# Patient Record
Sex: Male | Born: 1965
Health system: Southern US, Community
[De-identification: ages and names within clinical notes are randomized; demographics above are authoritative.]

## PROBLEM LIST (undated history)

## (undated) DIAGNOSIS — R748 Abnormal levels of other serum enzymes: Secondary | ICD-10-CM

## (undated) DIAGNOSIS — K089 Disorder of teeth and supporting structures, unspecified: Secondary | ICD-10-CM

## (undated) DIAGNOSIS — E785 Hyperlipidemia, unspecified: Secondary | ICD-10-CM

## (undated) DIAGNOSIS — K5792 Diverticulitis of intestine, part unspecified, without perforation or abscess without bleeding: Secondary | ICD-10-CM

## (undated) DIAGNOSIS — E538 Deficiency of other specified B group vitamins: Secondary | ICD-10-CM

## (undated) DIAGNOSIS — R0600 Dyspnea, unspecified: Secondary | ICD-10-CM

## (undated) DIAGNOSIS — K4090 Unilateral inguinal hernia, without obstruction or gangrene, not specified as recurrent: Secondary | ICD-10-CM

## (undated) DIAGNOSIS — R0602 Shortness of breath: Secondary | ICD-10-CM

## (undated) DIAGNOSIS — I2510 Atherosclerotic heart disease of native coronary artery without angina pectoris: Secondary | ICD-10-CM

## (undated) DIAGNOSIS — R109 Unspecified abdominal pain: Secondary | ICD-10-CM

## (undated) DIAGNOSIS — M109 Gout, unspecified: Secondary | ICD-10-CM

## (undated) DIAGNOSIS — I259 Chronic ischemic heart disease, unspecified: Secondary | ICD-10-CM

## (undated) DIAGNOSIS — I10 Essential (primary) hypertension: Secondary | ICD-10-CM

## (undated) DIAGNOSIS — R509 Fever, unspecified: Secondary | ICD-10-CM

## (undated) DIAGNOSIS — R52 Pain, unspecified: Secondary | ICD-10-CM

## (undated) DIAGNOSIS — E7800 Pure hypercholesterolemia, unspecified: Secondary | ICD-10-CM

## (undated) DIAGNOSIS — J029 Acute pharyngitis, unspecified: Secondary | ICD-10-CM

## (undated) DIAGNOSIS — R5383 Other fatigue: Secondary | ICD-10-CM

## (undated) DIAGNOSIS — K921 Melena: Secondary | ICD-10-CM

## (undated) DIAGNOSIS — D72829 Elevated white blood cell count, unspecified: Secondary | ICD-10-CM

## (undated) DIAGNOSIS — N529 Male erectile dysfunction, unspecified: Secondary | ICD-10-CM

## (undated) DIAGNOSIS — I429 Cardiomyopathy, unspecified: Secondary | ICD-10-CM

## (undated) DIAGNOSIS — I509 Heart failure, unspecified: Secondary | ICD-10-CM

## (undated) DIAGNOSIS — K529 Noninfective gastroenteritis and colitis, unspecified: Secondary | ICD-10-CM

## (undated) DIAGNOSIS — R110 Nausea: Secondary | ICD-10-CM

## (undated) DIAGNOSIS — E782 Mixed hyperlipidemia: Secondary | ICD-10-CM

## (undated) DIAGNOSIS — I219 Acute myocardial infarction, unspecified: Secondary | ICD-10-CM

## (undated) DIAGNOSIS — J329 Chronic sinusitis, unspecified: Secondary | ICD-10-CM

## (undated) DIAGNOSIS — L02619 Cutaneous abscess of unspecified foot: Secondary | ICD-10-CM

## (undated) DIAGNOSIS — F341 Dysthymic disorder: Secondary | ICD-10-CM

## (undated) DIAGNOSIS — Z6823 Body mass index (BMI) 23.0-23.9, adult: Secondary | ICD-10-CM

## (undated) DIAGNOSIS — R300 Dysuria: Secondary | ICD-10-CM

## (undated) DIAGNOSIS — R195 Other fecal abnormalities: Secondary | ICD-10-CM

## (undated) DIAGNOSIS — M25559 Pain in unspecified hip: Secondary | ICD-10-CM

## (undated) DIAGNOSIS — E119 Type 2 diabetes mellitus without complications: Secondary | ICD-10-CM

## (undated) DIAGNOSIS — R202 Paresthesia of skin: Secondary | ICD-10-CM

## (undated) DIAGNOSIS — E559 Vitamin D deficiency, unspecified: Secondary | ICD-10-CM

## (undated) DIAGNOSIS — I214 Non-ST elevation (NSTEMI) myocardial infarction: Secondary | ICD-10-CM

## (undated) DIAGNOSIS — R112 Nausea with vomiting, unspecified: Secondary | ICD-10-CM

## (undated) DIAGNOSIS — L309 Dermatitis, unspecified: Secondary | ICD-10-CM

## (undated) DIAGNOSIS — R6889 Other general symptoms and signs: Secondary | ICD-10-CM

## (undated) DIAGNOSIS — J40 Bronchitis, not specified as acute or chronic: Secondary | ICD-10-CM

## (undated) DIAGNOSIS — R9431 Abnormal electrocardiogram [ECG] [EKG]: Secondary | ICD-10-CM

## (undated) DIAGNOSIS — R062 Wheezing: Secondary | ICD-10-CM

## (undated) DIAGNOSIS — E860 Dehydration: Secondary | ICD-10-CM

## (undated) DIAGNOSIS — J069 Acute upper respiratory infection, unspecified: Secondary | ICD-10-CM

## (undated) DIAGNOSIS — J449 Chronic obstructive pulmonary disease, unspecified: Secondary | ICD-10-CM

## (undated) DIAGNOSIS — Z720 Tobacco use: Secondary | ICD-10-CM

## (undated) DIAGNOSIS — R059 Cough, unspecified: Secondary | ICD-10-CM

## (undated) DIAGNOSIS — R079 Chest pain, unspecified: Secondary | ICD-10-CM

## (undated) HISTORY — DX: Dysuria: R30.0

## (undated) HISTORY — DX: Chronic ischemic heart disease, unspecified: I25.9

## (undated) HISTORY — DX: Elevated white blood cell count, unspecified: D72.829

## (undated) HISTORY — DX: Acute upper respiratory infection, unspecified: J06.9

## (undated) HISTORY — DX: Unspecified abdominal pain: R10.9

## (undated) HISTORY — DX: Vitamin D deficiency, unspecified: E55.9

## (undated) HISTORY — DX: Dehydration: E86.0

## (undated) HISTORY — DX: Cough, unspecified: R05.9

## (undated) HISTORY — DX: Wheezing: R06.2

## (undated) HISTORY — DX: Gout, unspecified: M10.9

## (undated) HISTORY — DX: Tobacco use: Z72.0

## (undated) HISTORY — DX: Acute pharyngitis, unspecified: J02.9

## (undated) HISTORY — DX: Pure hypercholesterolemia, unspecified: E78.00

## (undated) HISTORY — DX: Chronic sinusitis, unspecified: J32.9

## (undated) HISTORY — DX: Other fecal abnormalities: R19.5

## (undated) HISTORY — DX: Male erectile dysfunction, unspecified: N52.9

## (undated) HISTORY — DX: Other general symptoms and signs: R68.89

## (undated) HISTORY — DX: Nausea: R11.0

## (undated) HISTORY — DX: Abnormal levels of other serum enzymes: R74.8

## (undated) HISTORY — DX: Shortness of breath: R06.02

## (undated) HISTORY — DX: Chest pain, unspecified: R07.9

## (undated) HISTORY — DX: Hyperlipidemia, unspecified: E78.5

## (undated) HISTORY — DX: Dyspnea, unspecified: R06.00

## (undated) HISTORY — DX: Pain, unspecified: R52

## (undated) HISTORY — DX: Other fatigue: R53.83

## (undated) HISTORY — DX: Mixed hyperlipidemia: E78.2

## (undated) HISTORY — DX: Dysthymic disorder: F34.1

## (undated) HISTORY — DX: Essential (primary) hypertension: I10

## (undated) HISTORY — DX: Cutaneous abscess of unspecified foot: L02.619

## (undated) HISTORY — DX: Diverticulitis of intestine, part unspecified, without perforation or abscess without bleeding: K57.92

## (undated) HISTORY — DX: Atherosclerotic heart disease of native coronary artery without angina pectoris: I25.10

## (undated) HISTORY — DX: Chronic obstructive pulmonary disease, unspecified: J44.9

## (undated) HISTORY — DX: Paresthesia of skin: R20.2

## (undated) HISTORY — DX: Melena: K92.1

## (undated) HISTORY — DX: Bronchitis, not specified as acute or chronic: J40

## (undated) HISTORY — DX: Nausea with vomiting, unspecified: R11.2

## (undated) HISTORY — DX: Non-ST elevation (NSTEMI) myocardial infarction: I21.4

## (undated) HISTORY — DX: Disorder of teeth and supporting structures, unspecified: K08.9

## (undated) HISTORY — DX: Heart failure, unspecified: I50.9

## (undated) HISTORY — DX: Body mass index (BMI) 23.0-23.9, adult: Z68.23

## (undated) HISTORY — PX: CORONARY ARTERY BYPASS GRAFT: SHX141

## (undated) HISTORY — DX: Deficiency of other specified B group vitamins: E53.8

## (undated) HISTORY — DX: Type 2 diabetes mellitus without complications: E11.9

## (undated) HISTORY — DX: Noninfective gastroenteritis and colitis, unspecified: K52.9

## (undated) HISTORY — DX: Unilateral inguinal hernia, without obstruction or gangrene, not specified as recurrent: K40.90

## (undated) HISTORY — DX: Abnormal electrocardiogram (ECG) (EKG): R94.31

## (undated) HISTORY — DX: Pain in unspecified hip: M25.559

## (undated) HISTORY — DX: Dermatitis, unspecified: L30.9

## (undated) HISTORY — DX: Fever, unspecified: R50.9

## (undated) HISTORY — DX: Cardiomyopathy, unspecified: I42.9

## (undated) HISTORY — DX: Acute myocardial infarction, unspecified: I21.9

---

## 2020-09-06 ENCOUNTER — Encounter (INDEPENDENT_AMBULATORY_CARE_PROVIDER_SITE_OTHER): Payer: Self-pay

## 2020-09-06 ENCOUNTER — Ambulatory Visit (INDEPENDENT_AMBULATORY_CARE_PROVIDER_SITE_OTHER): Payer: BC Managed Care – PPO | Admitting: Cardiology

## 2020-09-06 ENCOUNTER — Other Ambulatory Visit: Payer: Self-pay

## 2020-09-06 ENCOUNTER — Encounter: Payer: Self-pay | Admitting: Cardiology

## 2020-09-06 VITALS — BP 94/86 | HR 95 | Ht 70.0 in | Wt 197.0 lb

## 2020-09-06 DIAGNOSIS — I2510 Atherosclerotic heart disease of native coronary artery without angina pectoris: Secondary | ICD-10-CM | POA: Diagnosis not present

## 2020-09-06 DIAGNOSIS — I5042 Chronic combined systolic (congestive) and diastolic (congestive) heart failure: Secondary | ICD-10-CM | POA: Diagnosis not present

## 2020-09-06 DIAGNOSIS — I255 Ischemic cardiomyopathy: Secondary | ICD-10-CM

## 2020-09-06 NOTE — H&P (View-Only) (Signed)
Electrophysiology Office Note:    Date:  09/06/2020   ID:  Makael Stein, DOB 1966/04/05, MRN [id number]  PCP:  Perley Jain  [facility] Cardiologist:  No primary care provider on file.  [facility] Electrophysiologist:  Lanier Prude, MD   Referring MD: Martha Clan, MD  Chief Complaint: Chronic systolic heart failure secondary to ischemic cardiomyopathy  History of Present Illness:    Lejuan Botto is a 54 y.o. male who presents for an evaluation of chronic systolic heart failure secondary to ischemic cardiomyopathy at the request of Dr. Clelia Croft. Their medical history includes coronary artery disease, hypertension, diabetes, hyperlipidemia.  Mr. Stogsdill had Covid earlier in 2021.  Due to the associated respiratory troubles, he presented to the emergency department where his troponins were found to be significantly elevated.  In March of this year he received 5 stents (2 to the right coronary artery, 1 to the proximal circumflex artery, 1 to the second obtuse marginal, 1 to the first diagonal).  Follow-up echocardiogram on March 5 showed an ejection fraction of 25% with akinesis of the apical anterior and lateral segments.  In May of this year he was admitted to [facility] in part for consideration of coronary artery bypass surgery.  MRI was performed showing extensive anterior scarring.  CABG was canceled due to the extensive scar burden.  At his last appointment with his primary physician and October, he was noted to have NYHA class III symptoms.  Echocardiogram on June 28, 2020 showed an ejection fraction of 30%.  The patient has been seen by [facility] in the past including their electrophysiology section. He and his wife tell me that his procedures been scheduled for ICD implant but then it was delayed for unclear reasons. The patient and his referring physician are concerned about the delay in would like to expedite ICD implant. Thus, he presents today for  consideration of a biventricular ICD.  He and his wife tell me that his functional status has declined since his initial diagnosis earlier this year. Now he can stand up and do dishes but then will get fatigued quickly. No syncopal episodes.   Past Medical History:  Diagnosis Date  . Abdominal pain   . Abnormal EKG   . Abnormal liver enzymes   . Abscess of great toe   . Bloody stools   . BMI 23.0-23.9, adult   . Body aches   . Bronchitis   . CAD (coronary artery disease)   . Cardiomyopathy (HCC)   . Chest pain   . CHF (congestive heart failure) (HCC)   . Chills with fever   . COPD (chronic obstructive pulmonary disease) (HCC)   . Cough   . Dehydration   . Dermatitis   . Diverticulitis   . DM (diabetes mellitus) (HCC)   . Dyslipidemia   . Dyspnea   . Dysthymia   . Dysuria   . ED (erectile dysfunction)   . Fatigue   . Flu-like symptoms   . Gastroenteritis   . Gout   . Hernia, inguinal   . Hip pain   . Hypercholesterolemia   . Hypertension   . Ischemic heart disease   . Leukocytosis   . Loose stools   . MI (myocardial infarction) (HCC)   . Mixed hyperlipidemia   . Nausea   . Nausea and vomiting   . NSTEMI (non-ST elevated myocardial infarction) (HCC)   . Paresthesia   . Poor dentition   . Sinusitis   . SOB (  shortness of breath)   . Sore throat   . Tobacco abuse   . URI (upper respiratory infection)   . Vitamin B12 deficiency   . Vitamin D deficiency   . Wheezing     Past Surgical History:  Procedure Laterality Date  . CORONARY ARTERY BYPASS GRAFT      Current Medications: Current Meds  Medication Sig  . acetaminophen (TYLENOL) 500 MG tablet Take 1,000 mg by mouth every 6 (six) hours as needed.  Marland Kitchen albuterol (VENTOLIN HFA) 108 (90 Base) MCG/ACT inhaler Inhale 1 puff into the lungs every 6 (six) hours as needed for wheezing or shortness of breath.  . Albuterol Sulfate 2.5 MG/0.5ML NEBU Inhale into the lungs.  Marland Kitchen ascorbic acid (VITAMIN C) 1000 MG tablet  Take 2,000 mg by mouth daily.  Marland Kitchen aspirin EC 81 MG tablet Take 81 mg by mouth daily. Swallow whole.  . Budeson-Glycopyrrol-Formoterol (BREZTRI AEROSPHERE) 160-9-4.8 MCG/ACT AERO Inhale 2 puffs into the lungs 2 (two) times daily.  . budesonide-formoterol (SYMBICORT) 160-4.5 MCG/ACT inhaler Inhale 2 puffs into the lungs 2 (two) times daily.  . Cholecalciferol 50 MCG (2000 UT) CAPS Take 1 capsule by mouth daily.  . dapagliflozin propanediol (FARXIGA) 10 MG TABS tablet Take 10 mg by mouth daily.  . digoxin (LANOXIN) 0.125 MG tablet Take by mouth every other day.   Marland Kitchen JANUMET XR 50-500 MG TB24 Take 1 tablet by mouth daily.  . nitroGLYCERIN (NITROSTAT) 0.4 MG SL tablet Place 0.4 mg under the tongue every 5 (five) minutes as needed for chest pain.  Marland Kitchen ondansetron (ZOFRAN) 8 MG tablet Take by mouth every 8 (eight) hours as needed for nausea or vomiting.  . sacubitril-valsartan (ENTRESTO) 24-26 MG Take 1 tablet by mouth daily.  . simvastatin (ZOCOR) 20 MG tablet Take 20 mg by mouth daily.  Marland Kitchen spironolactone (ALDACTONE) 25 MG tablet Take 25 mg by mouth daily.  . ticagrelor (BRILINTA) 90 MG TABS tablet Take by mouth 2 (two) times daily.  Marland Kitchen torsemide (DEMADEX) 20 MG tablet Take 20 mg by mouth daily.  . Zinc 100 MG TABS Take 200 mg by mouth.   . [DISCONTINUED] sacubitril-valsartan (ENTRESTO) 24-26 MG Take 1 tablet by mouth 2 (two) times daily.  . [DISCONTINUED] sitaGLIPtin-metformin (JANUMET) 50-500 MG tablet Take 1 tablet by mouth 2 (two) times daily with a meal.     Allergies:   Patient has no known allergies.   Social History   Socioeconomic History  . Marital status: Married    Spouse name: Not on file  . Number of children: Not on file  . Years of education: Not on file  . Highest education level: Not on file  Occupational History  . Not on file  Tobacco Use  . Smoking status: Former Games developer  . Smokeless tobacco: Never Used  Substance and Sexual Activity  . Alcohol use: Never  . Drug use:  Never  . Sexual activity: Not on file  Other Topics Concern  . Not on file  Social History Narrative  . Not on file   Social Determinants of Health   Financial Resource Strain:   . Difficulty of Paying Living Expenses: Not on file  Food Insecurity:   . Worried About Programme researcher, broadcasting/film/video in the Last Year: Not on file  . Ran Out of Food in the Last Year: Not on file  Transportation Needs:   . Lack of Transportation (Medical): Not on file  . Lack of Transportation (Non-Medical): Not on file  Physical  Activity:   . Days of Exercise per Week: Not on file  . Minutes of Exercise per Session: Not on file  Stress:   . Feeling of Stress : Not on file  Social Connections:   . Frequency of Communication with Friends and Family: Not on file  . Frequency of Social Gatherings with Friends and Family: Not on file  . Attends Religious Services: Not on file  . Active Member of Clubs or Organizations: Not on file  . Attends Club or Organization Meetings: Not on file  . Marital Status: Not on file     Family History: The patient's family history includes Diabetes in his father, mother, and sister; Heart disease in his father; Hypertension in his father and mother.  ROS:   Please see the history of present illness.    All other systems reviewed and are negative.  EKGs/Labs/Other Studies Reviewed:    The following studies were reviewed today: Prior notes from outside hospital, ECG  EKG:  The ekg ordered today demonstrates sinus rhythm and right bundle branch block.  QRS duration appears to be 170 ms.  Recent Labs: No results found for requested labs within last 8760 hours.  Recent Lipid Panel No results found for: CHOL, TRIG, HDL, CHOLHDL, VLDL, LDLCALC, LDLDIRECT  Physical Exam:    VS:  BP 94/86   Pulse 95   Ht 5' 10" (1.778 m)   Wt 197 lb (89.4 kg)   SpO2 97%   BMI 28.27 kg/m     Wt Readings from Last 3 Encounters:  09/06/20 197 lb (89.4 kg)     GEN:  Well nourished, well  developed in no acute distress HEENT: Normal NECK: No JVD; No carotid bruits LYMPHATICS: No lymphadenopathy CARDIAC: RRR, no murmurs, rubs, gallops RESPIRATORY:  Clear to auscultation without rales, wheezing or rhonchi  ABDOMEN: Soft, non-tender, non-distended MUSCULOSKELETAL:  No edema; No deformity  SKIN: Warm and dry NEUROLOGIC:  Alert and oriented x 3 PSYCHIATRIC:  Normal affect   ASSESSMENT:    1. Chronic combined systolic and diastolic heart failure (HCC)   2. Ischemic cardiomyopathy   3. Coronary artery disease involving native coronary artery of native heart without angina pectoris    PLAN:    In order of problems listed above:  1. Chronic combined systolic and diastolic heart failure secondary to ischemic cardiomyopathy NYHA class III symptoms. Wide right bundle branch block pattern with a QRS duration of 170 ms. We discussed defibrillator therapy in detail including the traditional indications for CRT-D. Given his wide right bundle branch block pattern, I do think he is a candidate for CRT-D therapy. I discussed CRT-D procedure in depth with the patient including the risks and the potential that a coronary sinus lead would not be placed. I discussed how this procedure is already scheduled with [facility] and that I agreed with her plan but he and his wife both wanted to proceed with scheduling at [place].  The patient has an ischemic CM (EF 30%), NYHA Class III CHF, and CAD.  He is referred by Dr. Shaw for risk stratification of sudden death and consideration of ICD implantation.  At this time, he meets criteria for ICD implantation for primary prevention of sudden death.  I have had a thorough discussion with the patient reviewing options.  The patient and their family (if available) have had opportunities to ask questions and have them answered. The patient and I have decided together through a shared decision making process to implant   ICD at this time.   Risks, benefits,  alternatives to ICD implantation were discussed in detail with the patient today. The patient understands that the risks include but are not limited to bleeding, infection, pneumothorax, perforation, tamponade, vascular damage, renal failure, MI, stroke, death, inappropriate shocks, and lead dislodgement and wishes to proceed.  We will therefore schedule device implantation at the next available time.    Medication Adjustments/Labs and Tests Ordered: Current medicines are reviewed at length with the patient today.  Concerns regarding medicines are outlined above.  Orders Placed This Encounter  Procedures  . EKG 12-Lead   No orders of the defined types were placed in this encounter.    Signed, Steffanie Dunn, MD, [facility]  09/06/2020 9:27 PM    Electrophysiology [facility]

## 2020-09-06 NOTE — Progress Notes (Signed)
Electrophysiology Office Note:    Date:  09/06/2020   ID:  Christopher Garcia, DOB 1966/04/05, MRN [id number]  PCP:  Perley Jain  [facility] Cardiologist:  No primary care provider on file.  [facility] Electrophysiologist:  Lanier Prude, MD   Referring MD: Martha Clan, MD  Chief Complaint: Chronic systolic heart failure secondary to ischemic cardiomyopathy  History of Present Illness:    Christopher Garcia is a 54 y.o. male who presents for an evaluation of chronic systolic heart failure secondary to ischemic cardiomyopathy at the request of Dr. Clelia Croft. Their medical history includes coronary artery disease, hypertension, diabetes, hyperlipidemia.  Christopher Garcia had Covid earlier in 2021.  Due to the associated respiratory troubles, Christopher Garcia presented to the emergency department where Christopher Garcia troponins were found to be significantly elevated.  In March of this year Christopher Garcia received 5 stents (2 to the right coronary artery, 1 to the proximal circumflex artery, 1 to the second obtuse marginal, 1 to the first diagonal).  Follow-up echocardiogram on March 5 showed an ejection fraction of 25% with akinesis of the apical anterior and lateral segments.  In May of this year Christopher Garcia was admitted to [facility] in part for consideration of coronary artery bypass surgery.  MRI was performed showing extensive anterior scarring.  CABG was canceled due to the extensive scar burden.  At Christopher Garcia last appointment with Christopher Garcia primary physician and October, Christopher Garcia was noted to have NYHA class III symptoms.  Echocardiogram on June 28, 2020 showed an ejection fraction of 30%.  The patient has been seen by [facility] in the past including their electrophysiology section. Christopher Garcia and Christopher Garcia wife tell me that Christopher Garcia procedures been scheduled for ICD implant but then it was delayed for unclear reasons. The patient and Christopher Garcia referring physician are concerned about the delay in would like to expedite ICD implant. Thus, Christopher Garcia presents today for  consideration of a biventricular ICD.  Christopher Garcia and Christopher Garcia wife tell me that Christopher Garcia functional status has declined since Christopher Garcia initial diagnosis earlier this year. Now Christopher Garcia can stand up and do dishes but then will get fatigued quickly. No syncopal episodes.   Past Medical History:  Diagnosis Date  . Abdominal pain   . Abnormal EKG   . Abnormal liver enzymes   . Abscess of great toe   . Bloody stools   . BMI 23.0-23.9, adult   . Body aches   . Bronchitis   . CAD (coronary artery disease)   . Cardiomyopathy (HCC)   . Chest pain   . CHF (congestive heart failure) (HCC)   . Chills with fever   . COPD (chronic obstructive pulmonary disease) (HCC)   . Cough   . Dehydration   . Dermatitis   . Diverticulitis   . DM (diabetes mellitus) (HCC)   . Dyslipidemia   . Dyspnea   . Dysthymia   . Dysuria   . ED (erectile dysfunction)   . Fatigue   . Flu-like symptoms   . Gastroenteritis   . Gout   . Hernia, inguinal   . Hip pain   . Hypercholesterolemia   . Hypertension   . Ischemic heart disease   . Leukocytosis   . Loose stools   . MI (myocardial infarction) (HCC)   . Mixed hyperlipidemia   . Nausea   . Nausea and vomiting   . NSTEMI (non-ST elevated myocardial infarction) (HCC)   . Paresthesia   . Poor dentition   . Sinusitis   . SOB (  shortness of breath)   . Sore throat   . Tobacco abuse   . URI (upper respiratory infection)   . Vitamin B12 deficiency   . Vitamin D deficiency   . Wheezing     Past Surgical History:  Procedure Laterality Date  . CORONARY ARTERY BYPASS GRAFT      Current Medications: Current Meds  Medication Sig  . acetaminophen (TYLENOL) 500 MG tablet Take 1,000 mg by mouth every 6 (six) hours as needed.  Marland Kitchen albuterol (VENTOLIN HFA) 108 (90 Base) MCG/ACT inhaler Inhale 1 puff into the lungs every 6 (six) hours as needed for wheezing or shortness of breath.  . Albuterol Sulfate 2.5 MG/0.5ML NEBU Inhale into the lungs.  Marland Kitchen ascorbic acid (VITAMIN C) 1000 MG tablet  Take 2,000 mg by mouth daily.  Marland Kitchen aspirin EC 81 MG tablet Take 81 mg by mouth daily. Swallow whole.  . Budeson-Glycopyrrol-Formoterol (BREZTRI AEROSPHERE) 160-9-4.8 MCG/ACT AERO Inhale 2 puffs into the lungs 2 (two) times daily.  . budesonide-formoterol (SYMBICORT) 160-4.5 MCG/ACT inhaler Inhale 2 puffs into the lungs 2 (two) times daily.  . Cholecalciferol 50 MCG (2000 UT) CAPS Take 1 capsule by mouth daily.  . dapagliflozin propanediol (FARXIGA) 10 MG TABS tablet Take 10 mg by mouth daily.  . digoxin (LANOXIN) 0.125 MG tablet Take by mouth every other day.   Marland Kitchen JANUMET XR 50-500 MG TB24 Take 1 tablet by mouth daily.  . nitroGLYCERIN (NITROSTAT) 0.4 MG SL tablet Place 0.4 mg under the tongue every 5 (five) minutes as needed for chest pain.  Marland Kitchen ondansetron (ZOFRAN) 8 MG tablet Take by mouth every 8 (eight) hours as needed for nausea or vomiting.  . sacubitril-valsartan (ENTRESTO) 24-26 MG Take 1 tablet by mouth daily.  . simvastatin (ZOCOR) 20 MG tablet Take 20 mg by mouth daily.  Marland Kitchen spironolactone (ALDACTONE) 25 MG tablet Take 25 mg by mouth daily.  . ticagrelor (BRILINTA) 90 MG TABS tablet Take by mouth 2 (two) times daily.  Marland Kitchen torsemide (DEMADEX) 20 MG tablet Take 20 mg by mouth daily.  . Zinc 100 MG TABS Take 200 mg by mouth.   . [DISCONTINUED] sacubitril-valsartan (ENTRESTO) 24-26 MG Take 1 tablet by mouth 2 (two) times daily.  . [DISCONTINUED] sitaGLIPtin-metformin (JANUMET) 50-500 MG tablet Take 1 tablet by mouth 2 (two) times daily with a meal.     Allergies:   Patient has no known allergies.   Social History   Socioeconomic History  . Marital status: Married    Spouse name: Not on file  . Number of children: Not on file  . Years of education: Not on file  . Highest education level: Not on file  Occupational History  . Not on file  Tobacco Use  . Smoking status: Former Games developer  . Smokeless tobacco: Never Used  Substance and Sexual Activity  . Alcohol use: Never  . Drug use:  Never  . Sexual activity: Not on file  Other Topics Concern  . Not on file  Social History Narrative  . Not on file   Social Determinants of Health   Financial Resource Strain:   . Difficulty of Paying Living Expenses: Not on file  Food Insecurity:   . Worried About Programme researcher, broadcasting/film/video in the Last Year: Not on file  . Ran Out of Food in the Last Year: Not on file  Transportation Needs:   . Lack of Transportation (Medical): Not on file  . Lack of Transportation (Non-Medical): Not on file  Physical  Activity:   . Days of Exercise per Week: Not on file  . Minutes of Exercise per Session: Not on file  Stress:   . Feeling of Stress : Not on file  Social Connections:   . Frequency of Communication with Friends and Family: Not on file  . Frequency of Social Gatherings with Friends and Family: Not on file  . Attends Religious Services: Not on file  . Active Member of Clubs or Organizations: Not on file  . Attends BankerClub or Organization Meetings: Not on file  . Marital Status: Not on file     Family History: The patient's family history includes Diabetes in Christopher Garcia father, mother, and sister; Heart disease in Christopher Garcia father; Hypertension in Christopher Garcia father and mother.  ROS:   Please see the history of present illness.    All other systems reviewed and are negative.  EKGs/Labs/Other Studies Reviewed:    The following studies were reviewed today: Prior notes from outside hospital, ECG  EKG:  The ekg ordered today demonstrates sinus rhythm and right bundle branch block.  QRS duration appears to be 170 ms.  Recent Labs: No results found for requested labs within last 8760 hours.  Recent Lipid Panel No results found for: CHOL, TRIG, HDL, CHOLHDL, VLDL, LDLCALC, LDLDIRECT  Physical Exam:    VS:  BP 94/86   Pulse 95   Ht 5\' 10"  (1.778 m)   Wt 197 lb (89.4 kg)   SpO2 97%   BMI 28.27 kg/m     Wt Readings from Last 3 Encounters:  09/06/20 197 lb (89.4 kg)     GEN:  Well nourished, well  developed in no acute distress HEENT: Normal NECK: No JVD; No carotid bruits LYMPHATICS: No lymphadenopathy CARDIAC: RRR, no murmurs, rubs, gallops RESPIRATORY:  Clear to auscultation without rales, wheezing or rhonchi  ABDOMEN: Soft, non-tender, non-distended MUSCULOSKELETAL:  No edema; No deformity  SKIN: Warm and dry NEUROLOGIC:  Alert and oriented x 3 PSYCHIATRIC:  Normal affect   ASSESSMENT:    1. Chronic combined systolic and diastolic heart failure (HCC)   2. Ischemic cardiomyopathy   3. Coronary artery disease involving native coronary artery of native heart without angina pectoris    PLAN:    In order of problems listed above:  1. Chronic combined systolic and diastolic heart failure secondary to ischemic cardiomyopathy NYHA class III symptoms. Wide right bundle branch block pattern with a QRS duration of 170 ms. We discussed defibrillator therapy in detail including the traditional indications for CRT-D. Given Christopher Garcia wide right bundle branch block pattern, I do think Christopher Garcia is a candidate for CRT-D therapy. I discussed CRT-D procedure in depth with the patient including the risks and the potential that a coronary sinus lead would not be placed. I discussed how [facility] and that I agreed with her plan but Christopher Garcia and Christopher Garcia wife both [facility].  The patient has an ischemic CM (EF 30%), NYHA Class III CHF, and CAD.  Christopher Garcia is referred by Dr. Clelia CroftShaw for risk stratification of sudden death and consideration of ICD implantation.  At this time, Christopher Garcia meets criteria for ICD implantation for primary prevention of sudden death.  I have had a thorough discussion with the patient reviewing options.  The patient and their family (if available) have had opportunities to ask questions and have them answered. The patient and I have decided together through a shared decision making process to implant  ICD at this time.   Risks, benefits,  alternatives to ICD implantation were discussed in detail with the patient today. The patient understands that the risks include but are not limited to bleeding, infection, pneumothorax, perforation, tamponade, vascular damage, renal failure, MI, stroke, death, inappropriate shocks, and lead dislodgement and wishes to proceed.  We will therefore schedule device implantation at the next available time.    Medication Adjustments/Labs and Tests Ordered: Current medicines are reviewed at length with the patient today.  Concerns regarding medicines are outlined above.  Orders Placed This Encounter  Procedures  . EKG 12-Lead   No orders of the defined types were placed in this encounter.    Signed, Steffanie Dunn, MD, [facility]  09/06/2020 9:27 PM    Electrophysiology [facility]

## 2020-09-06 NOTE — Patient Instructions (Addendum)
Medication Instructions:  Your physician recommends that you continue on your current medications as directed. Please refer to the Current Medication list given to you today. *If you need a refill on your cardiac medications before your next appointment, please call your pharmacy*  Lab Work: None ordered. If you have labs (blood work) drawn today and your tests are completely normal, you will receive your results only by: Marland Kitchen MyChart Message (if you have MyChart) OR . A paper copy in the mail If you have any lab test that is abnormal or we need to change your treatment, we will call you to review the results.  Testing/Procedures: None ordered.  Follow-Up: At [facility], you and your health needs are our priority.  As part of our continuing mission to provide you with exceptional heart care, we have created designated Provider Care Teams.  These Care Teams include your primary Cardiologist (physician) and Advanced Practice Providers (APPs -  Physician Assistants and Nurse Practitioners) who all work together to provide you with the care you need, when you need it.  We recommend signing up for the patient portal called "MyChart".  Sign up information is provided on this After Visit Summary.  MyChart is used to connect with patients for Virtual Visits (Telemedicine).  Patients are able to view lab/test results, encounter notes, upcoming appointments, etc.  Non-urgent messages can be sent to your provider as well.   To learn more about what you can do with MyChart, go to [email or web address].    Your next appointment:    SEE INSTRUCTION LETTER

## 2020-09-24 ENCOUNTER — Other Ambulatory Visit (HOSPITAL_COMMUNITY)
Admission: RE | Admit: 2020-09-24 | Discharge: 2020-09-24 | Disposition: A | Discharge status: Home / Self Care | Payer: BC Managed Care – PPO | Source: Ambulatory Visit | Attending: Cardiology | Admitting: Cardiology

## 2020-09-24 DIAGNOSIS — Z01812 Encounter for preprocedural laboratory examination: Secondary | ICD-10-CM | POA: Insufficient documentation

## 2020-09-24 DIAGNOSIS — Z20822 Contact with and (suspected) exposure to covid-19: Secondary | ICD-10-CM | POA: Diagnosis not present

## 2020-09-24 LAB — SARS CORONAVIRUS 2 (TAT 6-24 HRS): SARS Coronavirus 2: NEGATIVE

## 2020-09-24 NOTE — Progress Notes (Signed)
Instructed patient on the following items: Arrival time 1030  Nothing to eat or drink after midnight No meds AM of procedure Responsible person to drive you home and stay with you for 24 hrs Wash with special soap night before and morning of procedure 

## 2020-09-27 ENCOUNTER — Ambulatory Visit (HOSPITAL_COMMUNITY)
Admission: RE | Admit: 2020-09-27 | Discharge: 2020-09-28 | Disposition: A | Discharge status: Home / Self Care | Payer: BC Managed Care – PPO | Attending: Cardiology | Admitting: Cardiology

## 2020-09-27 ENCOUNTER — Ambulatory Visit (HOSPITAL_COMMUNITY)
Admission: RE | Disposition: A | Discharge status: Home / Self Care | Payer: Self-pay | Source: Home / Self Care | Attending: Cardiology

## 2020-09-27 ENCOUNTER — Other Ambulatory Visit: Payer: Self-pay

## 2020-09-27 DIAGNOSIS — Z7951 Long term (current) use of inhaled steroids: Secondary | ICD-10-CM | POA: Diagnosis not present

## 2020-09-27 DIAGNOSIS — I5022 Chronic systolic (congestive) heart failure: Secondary | ICD-10-CM | POA: Insufficient documentation

## 2020-09-27 DIAGNOSIS — Z7982 Long term (current) use of aspirin: Secondary | ICD-10-CM | POA: Diagnosis not present

## 2020-09-27 DIAGNOSIS — Z951 Presence of aortocoronary bypass graft: Secondary | ICD-10-CM | POA: Insufficient documentation

## 2020-09-27 DIAGNOSIS — Z006 Encounter for examination for normal comparison and control in clinical research program: Secondary | ICD-10-CM | POA: Diagnosis not present

## 2020-09-27 DIAGNOSIS — I110 Hypertensive heart disease with heart failure: Secondary | ICD-10-CM | POA: Diagnosis not present

## 2020-09-27 DIAGNOSIS — E119 Type 2 diabetes mellitus without complications: Secondary | ICD-10-CM | POA: Insufficient documentation

## 2020-09-27 DIAGNOSIS — I255 Ischemic cardiomyopathy: Secondary | ICD-10-CM | POA: Diagnosis not present

## 2020-09-27 DIAGNOSIS — Z7984 Long term (current) use of oral hypoglycemic drugs: Secondary | ICD-10-CM | POA: Insufficient documentation

## 2020-09-27 DIAGNOSIS — Z8249 Family history of ischemic heart disease and other diseases of the circulatory system: Secondary | ICD-10-CM | POA: Insufficient documentation

## 2020-09-27 DIAGNOSIS — J449 Chronic obstructive pulmonary disease, unspecified: Secondary | ICD-10-CM | POA: Diagnosis not present

## 2020-09-27 DIAGNOSIS — Z79899 Other long term (current) drug therapy: Secondary | ICD-10-CM | POA: Diagnosis not present

## 2020-09-27 DIAGNOSIS — Z87891 Personal history of nicotine dependence: Secondary | ICD-10-CM | POA: Insufficient documentation

## 2020-09-27 DIAGNOSIS — Z95810 Presence of automatic (implantable) cardiac defibrillator: Secondary | ICD-10-CM

## 2020-09-27 DIAGNOSIS — Z955 Presence of coronary angioplasty implant and graft: Secondary | ICD-10-CM | POA: Diagnosis not present

## 2020-09-27 DIAGNOSIS — I2510 Atherosclerotic heart disease of native coronary artery without angina pectoris: Secondary | ICD-10-CM | POA: Diagnosis not present

## 2020-09-27 DIAGNOSIS — I4510 Unspecified right bundle-branch block: Secondary | ICD-10-CM | POA: Diagnosis not present

## 2020-09-27 HISTORY — PX: BIV ICD INSERTION CRT-D: EP1195

## 2020-09-27 LAB — CBC
HCT: 48 % (ref 39.0–52.0)
Hemoglobin: 15.3 g/dL (ref 13.0–17.0)
MCH: 30.7 pg (ref 26.0–34.0)
MCHC: 31.9 g/dL (ref 30.0–36.0)
MCV: 96.4 fL (ref 80.0–100.0)
Platelets: 199 10*3/uL (ref 150–400)
RBC: 4.98 MIL/uL (ref 4.22–5.81)
RDW: 13.4 % (ref 11.5–15.5)
WBC: 10.5 10*3/uL (ref 4.0–10.5)
nRBC: 0 % (ref 0.0–0.2)

## 2020-09-27 LAB — BASIC METABOLIC PANEL
Anion gap: 9 (ref 5–15)
BUN: 14 mg/dL (ref 6–20)
CO2: 26 mmol/L (ref 22–32)
Calcium: 9.6 mg/dL (ref 8.9–10.3)
Chloride: 104 mmol/L (ref 98–111)
Creatinine, Ser: 1.21 mg/dL (ref 0.61–1.24)
GFR, Estimated: >60 mL/min (ref >60)
Glucose, Bld: 197 mg/dL — ABNORMAL HIGH (ref 70–99)
Potassium: 4 mmol/L (ref 3.5–5.1)
Sodium: 139 mmol/L (ref 135–145)

## 2020-09-27 LAB — GLUCOSE, CAPILLARY
Glucose-Capillary: 217 mg/dL — ABNORMAL HIGH (ref 70–99)
Glucose-Capillary: 189 mg/dL — ABNORMAL HIGH (ref 70–99)
Glucose-Capillary: 229 mg/dL — ABNORMAL HIGH (ref 70–99)

## 2020-09-27 SURGERY — BIV ICD INSERTION CRT-D

## 2020-09-27 MED ORDER — SIMVASTATIN 20 MG PO TABS
20.0000 mg | ORAL_TABLET | Freq: Every day | ORAL | Status: DC
  Administered 2020-09-27: 20 mg via ORAL
  Filled 2020-09-27: qty 1

## 2020-09-27 MED ORDER — SODIUM CHLORIDE 0.9 % IV SOLN
80.0000 mg | INTRAVENOUS | Status: AC
  Administered 2020-09-27: 80 mg

## 2020-09-27 MED ORDER — SPIRONOLACTONE 25 MG PO TABS
25.0000 mg | ORAL_TABLET | Freq: Every day | ORAL | Status: DC
  Administered 2020-09-28: 25 mg via ORAL
  Filled 2020-09-27: qty 1

## 2020-09-27 MED ORDER — MIDAZOLAM HCL 5 MG/5ML IJ SOLN
INTRAMUSCULAR | Status: DC | PRN
  Administered 2020-09-27 (×4): 1 mg via INTRAVENOUS

## 2020-09-27 MED ORDER — ONDANSETRON HCL 4 MG/2ML IJ SOLN: 4.0000 mg | Freq: Four times a day (QID) | INTRAMUSCULAR | Status: DC | PRN

## 2020-09-27 MED ORDER — HEPARIN (PORCINE) IN NACL 1000-0.9 UT/500ML-% IV SOLN
INTRAVENOUS | Status: DC | PRN
  Administered 2020-09-27: 500 mL

## 2020-09-27 MED ORDER — SACUBITRIL-VALSARTAN 24-26 MG PO TABS
1.0000 | ORAL_TABLET | Freq: Two times a day (BID) | ORAL | Status: DC
  Administered 2020-09-27 – 2020-09-28 (×2): 1 via ORAL
  Filled 2020-09-27 (×2): qty 1

## 2020-09-27 MED ORDER — CEFAZOLIN SODIUM-DEXTROSE 2-4 GM/100ML-% IV SOLN
INTRAVENOUS | Status: AC
  Filled 2020-09-27: qty 100

## 2020-09-27 MED ORDER — SODIUM CHLORIDE 0.9 % IV SOLN: INTRAVENOUS | Status: DC

## 2020-09-27 MED ORDER — FENTANYL CITRATE (PF) 100 MCG/2ML IJ SOLN
INTRAMUSCULAR | Status: DC | PRN
  Administered 2020-09-27 (×4): 25 ug via INTRAVENOUS

## 2020-09-27 MED ORDER — DAPAGLIFLOZIN PROPANEDIOL 10 MG PO TABS
10.0000 mg | ORAL_TABLET | Freq: Every day | ORAL | Status: DC
  Administered 2020-09-28: 10 mg via ORAL
  Filled 2020-09-27: qty 1

## 2020-09-27 MED ORDER — LIDOCAINE HCL (PF) 1 % IJ SOLN
INTRAMUSCULAR | Status: DC | PRN
  Administered 2020-09-27: 60 mL

## 2020-09-27 MED ORDER — MIDAZOLAM HCL 5 MG/5ML IJ SOLN
INTRAMUSCULAR | Status: AC
  Filled 2020-09-27: qty 5

## 2020-09-27 MED ORDER — FENTANYL CITRATE (PF) 100 MCG/2ML IJ SOLN
INTRAMUSCULAR | Status: AC
  Filled 2020-09-27: qty 2

## 2020-09-27 MED ORDER — SODIUM CHLORIDE 0.9 % IV SOLN
INTRAVENOUS | Status: AC
  Filled 2020-09-27: qty 2

## 2020-09-27 MED ORDER — ACETAMINOPHEN 325 MG PO TABS
325.0000 mg | ORAL_TABLET | ORAL | Status: DC | PRN
  Administered 2020-09-27 – 2020-09-28 (×2): 650 mg via ORAL
  Filled 2020-09-27 (×2): qty 2

## 2020-09-27 MED ORDER — CHLORHEXIDINE GLUCONATE 4 % EX LIQD: 4.0000 "application " | Freq: Once | CUTANEOUS | Status: DC

## 2020-09-27 MED ORDER — HEPARIN (PORCINE) IN NACL 1000-0.9 UT/500ML-% IV SOLN
INTRAVENOUS | Status: AC
  Filled 2020-09-27: qty 500

## 2020-09-27 MED ORDER — CEFAZOLIN SODIUM-DEXTROSE 1-4 GM/50ML-% IV SOLN
1.0000 g | Freq: Four times a day (QID) | INTRAVENOUS | Status: AC
  Administered 2020-09-27: 1 g via INTRAVENOUS
  Filled 2020-09-27 (×2): qty 50

## 2020-09-27 MED ORDER — CEFAZOLIN SODIUM-DEXTROSE 2-4 GM/100ML-% IV SOLN
2.0000 g | INTRAVENOUS | Status: AC
  Administered 2020-09-27: 2 g via INTRAVENOUS

## 2020-09-27 MED ORDER — LIDOCAINE HCL 1 % IJ SOLN
INTRAMUSCULAR | Status: AC
  Filled 2020-09-27: qty 60

## 2020-09-27 MED ORDER — IOHEXOL 350 MG/ML SOLN
INTRAVENOUS | Status: DC | PRN
  Administered 2020-09-27: 15 mL

## 2020-09-27 SURGICAL SUPPLY — 26 items
ADAPTER SEALING SSA-EW-09 (ADAPTER) ×2 IMPLANT
ADPR INTRO LNG 9FR SL XTD WNG (ADAPTER) ×1
BALLN COR SINUS VENO 6F 80CM (BALLOONS) ×1
BALLN COR SINUS VENO 6FR 80 (BALLOONS) ×2
BALLOON COR SINUS VENO 6FR 80 (BALLOONS) IMPLANT
CABLE SURGICAL S-101-97-12 (CABLE) ×3 IMPLANT
CATH ATTAIN COM SURV 6250V-EH (CATHETERS) ×2 IMPLANT
CATH ATTAIN SEL SURV 6248V-130 (CATHETERS) ×2 IMPLANT
COVER SWIFTLINK CONNECTOR (BAG) ×4 IMPLANT
ICD GALLANT HFCRTD CDHFA500Q (ICD Generator) ×2 IMPLANT
KIT ESSENTIALS PG (KITS) ×2 IMPLANT
LEAD DURATA 7122Q-65CM (Lead) ×2 IMPLANT
LEAD QUARTET 1456Q-86 (Lead) IMPLANT
LEAD TENDRIL MRI 52CM LPA1200M (Lead) ×2 IMPLANT
PAD PRO RADIOLUCENT 2001M-C (PAD) ×3 IMPLANT
POUCH AIGIS-R ANTIBACT ICD (Mesh General) ×3 IMPLANT
POUCH AIGIS-R ANTIBACT ICD LRG (Mesh General) IMPLANT
QUARTET 1456Q-86 (Lead) ×3 IMPLANT
SHEATH 7FR PRELUDE SNAP 13 (SHEATH) ×2 IMPLANT
SHEATH 8FR PRELUDE SNAP 13 (SHEATH) ×2 IMPLANT
SHEATH 9.5FR PRELUDE SNAP 13 (SHEATH) ×4 IMPLANT
SLITTER 6232ADJ (MISCELLANEOUS) ×4 IMPLANT
TRAY PACEMAKER INSERTION (PACKS) ×3 IMPLANT
WIRE ACUITY WHISPER EDS 4648 (WIRE) ×4 IMPLANT
WIRE HI TORQ VERSACORE-J 145CM (WIRE) ×2 IMPLANT
WIRE MAILMAN 182CM (WIRE) ×2 IMPLANT

## 2020-09-27 NOTE — Interval H&P Note (Signed)
History and Physical Interval Note:  09/27/2020 12:51 PM  Christopher Garcia  has presented today for surgery, with the diagnosis of cardiomyopathy.  The various methods of treatment have been discussed with the patient and family. After consideration of risks, benefits and other options for treatment, the patient has consented to  Procedure(s): BIV ICD INSERTION CRT-D (N/A) as a surgical intervention.  The patient's history has been reviewed, patient examined, no change in status, stable for surgery.  I have reviewed the patient's chart and labs.  Questions were answered to the patient's satisfaction.     Ambur Province T Greene Diodato

## 2020-09-28 ENCOUNTER — Ambulatory Visit (HOSPITAL_COMMUNITY): Payer: BC Managed Care – PPO

## 2020-09-28 ENCOUNTER — Encounter (HOSPITAL_COMMUNITY): Payer: Self-pay | Admitting: Cardiology

## 2020-09-28 DIAGNOSIS — I4510 Unspecified right bundle-branch block: Secondary | ICD-10-CM | POA: Diagnosis not present

## 2020-09-28 DIAGNOSIS — I255 Ischemic cardiomyopathy: Secondary | ICD-10-CM

## 2020-09-28 DIAGNOSIS — Z006 Encounter for examination for normal comparison and control in clinical research program: Secondary | ICD-10-CM | POA: Diagnosis not present

## 2020-09-28 DIAGNOSIS — I110 Hypertensive heart disease with heart failure: Secondary | ICD-10-CM | POA: Diagnosis not present

## 2020-09-28 LAB — GLUCOSE, CAPILLARY: Glucose-Capillary: 187 mg/dL — ABNORMAL HIGH (ref 70–99)

## 2020-09-28 MED FILL — Lidocaine HCl Local Inj 1%: INTRAMUSCULAR | Qty: 60 | Status: AC

## 2020-09-28 NOTE — Discharge Instructions (Signed)
    Supplemental Discharge Instructions for  Pacemaker/Defibrillator Patients    Activity No heavy lifting or vigorous activity with your left/right arm for 6 to 8 weeks.  Do not raise your left/right arm above your head for one week.  Gradually raise your affected arm as drawn below.             10/01/2020               10/02/2020             10/03/2020             10/04/2020 __  NO DRIVING for  1 week   ; you may begin driving on  15/40/0867   .  WOUND CARE - Keep the wound area clean and dry.  Do not get this area wet , no showers for one week; you may shower on 10/04/2020 . - The tape/steri-strips on your wound will fall off; do not pull them off.  No bandage is needed on the site.  DO  NOT apply any creams, oils, or ointments to the wound area. - If you notice any drainage or discharge from the wound, any swelling or bruising at the site, or you develop a fever > 101? F after you are discharged home, call the office at once.  Special Instructions - You are still able to use cellular telephones; use the ear opposite the side where you have your pacemaker/defibrillator.  Avoid carrying your cellular phone near your device. - When traveling through airports, show security personnel your identification card to avoid being screened in the metal detectors.  Ask the security personnel to use the hand wand. - Avoid arc welding equipment, MRI testing (magnetic resonance imaging), TENS units (transcutaneous nerve stimulators).  Call the office for questions about other devices. - Avoid electrical appliances that are in poor condition or are not properly grounded. - Microwave ovens are safe to be near or to operate.  Additional information for defibrillator patients should your device go off: - If your device goes off ONCE and you feel fine afterward, notify the device clinic nurses. - If your device goes off ONCE and you do not feel well afterward, call 911. - If your device goes off TWICE,  call 911. - If your device goes off THREE times in one day, call 911.  DO NOT DRIVE YOURSELF OR A FAMILY MEMBER WITH A DEFIBRILLATOR TO THE HOSPITAL--CALL 911.

## 2020-09-28 NOTE — Discharge Summary (Addendum)
ELECTROPHYSIOLOGY PROCEDURE DISCHARGE SUMMARY    Patient ID: Derico Mitton,  MRN: [id number], DOB/AGE: August 25, 1966 54 y.o.  Admit date: 09/27/2020 Discharge date: 09/28/2020  Primary Care Physician: Doreen Salvage, PA-C  Primary Cardiologist: Dr. Clelia Croft Electrophysiologist: Dr. Lalla Brothers  Primary Discharge Diagnosis:  1. ICM 2. Chronic CHF (systolic)  Secondary Discharge Diagnosis:  1. CAD     S/p multiple stents March 2021 2. COPD 3. HTN 4. HLD 5. DM  No Known Allergies   Procedures This Admission:  1.  Implantation of a Abott CRT-D on 09/27/2020 by Dr Lalla Brothers.  The patient received a  (model tendril MRI LPA 1200 M 52 cm, serial number [id number]), (model Durata 7122Q-65, serial BPA [id number]), (Quartet 1456Q 86 cm, serial number [facility] [id number]), Gallant HF generator (serial [id number] DFT's were deferred at time of implant.  There were no immediate post procedure complications. 2.  CXR on 09/28/2020 demonstrated no pneumothorax status post device implantation.   Brief HPI: Christopher Garcia is a 54 y.o. male was referred to electrophysiology in the outpatient setting for consideration of ICD implantation.  Past medical history includes above.  The patient has persistent LV dysfunction despite guideline directed therapy.  Risks, benefits, and alternatives to ICD implantation were reviewed with the patient who wished to proceed.   Hospital Course:  The patient was admitted and underwent implantation of a CRT-D with details as outlined above.  He was monitored on telemetry overnight which demonstrated SR/VP.  Left chest was without hematoma or ecchymosis.  The device was interrogated and found to be functioning normally.  CXR was obtained and demonstrated no pneumothorax status post device implantation.  Wound care, arm mobility, and restrictions were reviewed with the patient.  The patient feels well,  denies any CP or SOB, minimal site discomfort, he was examined by Dr. Lalla Brothers and  considered stable for discharge to home.   The patient's discharge medications include an ACE/ARB Sherryll Burger) No betablocker (historically on coreg and then metoprolol, was stopped and not resumed by his HF team Dr. Laverda Page who continued pt OFF betablocker therapy by their last not on 08/05/20).  I discussed with the patient his Entresto dosing (daily), he confirms that at one time he was taking it BID though one of his heart doctors told him to reduce to daily. He can not recall why, but is certain that he was told only once daily, we will defer his medications to his primary cardiologist/heart failure team.  Instructed patient not to resume Brilinta until Thursday 09/30/20   Physical Exam: Vitals:   09/27/20 2023 09/27/20 2308 09/28/20 0100 09/28/20 0308  BP: (!) 115/92 (!) 118/91  102/88  Pulse:  82  98  Resp: 18 18  16   Temp: 97.9 F (36.6 C)  98 F (36.7 C) 97.9 F (36.6 C)  TempSrc: Oral   Oral  SpO2: 99% 98%  96%  Weight:      Height:        GEN- The patient is well appearing, alert and oriented x 3 today.   HEENT: normocephalic, atraumatic; sclera clear, conjunctiva pink; hearing intact; oropharynx clear Lungs- CTA b/l, normal work of breathing.  No wheezes, rales, rhonchi Heart- RRR, no murmurs, rubs or gallops, PMI not laterally displaced GI- soft, non-tender, non-distended Extremities- no clubbing, cyanosis, or edema MS- no significant deformity or atrophy Skin- warm and dry, no rash or lesion, left chest without hematoma/ecchymosis Psych- euthymic mood, full affect Neuro- no gross defecits  Labs:   Lab Results  Component Value Date   WBC 10.5 09/27/2020   HGB 15.3 09/27/2020   HCT 48.0 09/27/2020   MCV 96.4 09/27/2020   PLT 199 09/27/2020    Recent Labs  Lab 09/27/20 1042  NA 139  K 4.0  CL 104  CO2 26  BUN 14  CREATININE 1.21  CALCIUM 9.6  GLUCOSE 197*    Discharge Medications:  Allergies as of 09/28/2020   No Known Allergies     Medication List      TAKE these medications   acetaminophen 500 MG tablet Commonly known as: TYLENOL Take 1,000 mg by mouth every 6 (six) hours as needed for headache.   Albuterol Sulfate 2.5 MG/0.5ML Nebu Inhale 2.5 mg into the lungs daily as needed (COPD).   albuterol 108 (90 Base) MCG/ACT inhaler Commonly known as: VENTOLIN HFA Inhale 1 puff into the lungs every 6 (six) hours as needed for wheezing or shortness of breath.   ascorbic acid 1000 MG tablet Commonly known as: VITAMIN C Take 1,000 mg by mouth daily.   aspirin EC 81 MG tablet Take 81 mg by mouth daily. Swallow whole.   Breztri Aerosphere 160-9-4.8 MCG/ACT Aero Generic drug: Budeson-Glycopyrrol-Formoterol Inhale 2 puffs into the lungs 2 (two) times daily.   Brilinta 90 MG Tabs tablet Generic drug: ticagrelor Take 90 mg by mouth 2 (two) times daily. Notes to patient: Do not resume until Thursday 09/30/20   Cholecalciferol 50 MCG (2000 UT) Caps Take 4,000 Units by mouth daily.   digoxin 0.125 MG tablet Commonly known as: LANOXIN Take 0.125 mg by mouth See admin instructions. Take tues., thurs, Sat and Sun.   Farxiga 10 MG Tabs tablet Generic drug: dapagliflozin propanediol Take 10 mg by mouth daily.   Janumet XR 50-500 MG Tb24 Generic drug: SitaGLIPtin-MetFORMIN HCl Take 1 tablet by mouth daily.   nitroGLYCERIN 0.4 MG SL tablet Commonly known as: NITROSTAT Place 0.4 mg under the tongue every 5 (five) minutes as needed for chest pain.   ondansetron 8 MG tablet Commonly known as: ZOFRAN Take 8 mg by mouth every 8 (eight) hours as needed for nausea or vomiting.   sacubitril-valsartan 24-26 MG Commonly known as: ENTRESTO Take 1 tablet by mouth daily. Notes to patient: Please confirm the dosing frequency  with your primary cardiology team.   simvastatin 20 MG tablet Commonly known as: ZOCOR Take 20 mg by mouth at bedtime.   spironolactone 25 MG tablet Commonly known as: ALDACTONE Take 25 mg by mouth daily.    torsemide 20 MG tablet Commonly known as: DEMADEX Take 20 mg by mouth daily as needed (Take if weight is over 187 lbs).   Zinc 100 MG Tabs Take 100 mg by mouth daily.            Discharge Care Instructions  (From admission, onward)         Start     Ordered   09/28/20 0000  Discharge wound care:       Comments: As instructed in the AVS   09/28/20 0935          Disposition:  Discharge Instructions    Diet - low sodium heart healthy   Complete by: As directed    Discharge wound care:   Complete by: As directed    As instructed in the AVS   Increase activity slowly   Complete by: As directed       Follow-up Information    [facility] Follow [facility].  Specialty: Cardiology Why: 10/07/2020 @ 8:40AM, woudn check visit Contact information: [address], [address] [place] [address] [phone]       Lanier Prude, MD Follow up.   Specialties: Cardiology, Radiology Why: 01/04/2021 @ 2:30PM Contact information: [address] [place] [address] [phone]               Duration of Discharge Encounter: Greater than 30 minutes including physician time.  Norma Fredrickson, PA-C 09/28/2020 10:33 AM

## 2020-09-28 NOTE — Plan of Care (Signed)

## 2020-09-28 NOTE — Plan of Care (Signed)
  Problem: Education: Goal: Knowledge of General Education information will improve Description: Including pain rating scale, medication(s)/side effects and non-pharmacologic comfort measures 09/28/2020 1023 by Durward Fortes, RN Outcome: Adequate for Discharge 09/28/2020 0729 by Durward Fortes, RN Outcome: Progressing   Problem: Health Behavior/Discharge Planning: Goal: Ability to manage health-related needs will improve 09/28/2020 1023 by Durward Fortes, RN Outcome: Adequate for Discharge 09/28/2020 0729 by Durward Fortes, RN Outcome: Progressing   Problem: Clinical Measurements: Goal: Ability to maintain clinical measurements within normal limits will improve 09/28/2020 1023 by Durward Fortes, RN Outcome: Adequate for Discharge 09/28/2020 0729 by Durward Fortes, RN Outcome: Progressing Goal: Will remain free from infection 09/28/2020 1023 by Durward Fortes, RN Outcome: Adequate for Discharge 09/28/2020 0729 by Durward Fortes, RN Outcome: Progressing Goal: Diagnostic test results will improve 09/28/2020 1023 by Durward Fortes, RN Outcome: Adequate for Discharge 09/28/2020 0729 by Durward Fortes, RN Outcome: Progressing Goal: Respiratory complications will improve 09/28/2020 1023 by Durward Fortes, RN Outcome: Adequate for Discharge 09/28/2020 0729 by Durward Fortes, RN Outcome: Progressing Goal: Cardiovascular complication will be avoided 09/28/2020 1023 by Durward Fortes, RN Outcome: Adequate for Discharge 09/28/2020 0729 by Durward Fortes, RN Outcome: Progressing   Problem: Activity: Goal: Risk for activity intolerance will decrease 09/28/2020 1023 by Durward Fortes, RN Outcome: Adequate for Discharge 09/28/2020 0729 by Durward Fortes, RN Outcome: Progressing   Problem: Nutrition: Goal: Adequate nutrition will be maintained 09/28/2020 1023 by Durward Fortes, RN Outcome: Adequate for Discharge 09/28/2020 0729 by Durward Fortes, RN Outcome: Progressing   Problem:  Coping: Goal: Level of anxiety will decrease 09/28/2020 1023 by Durward Fortes, RN Outcome: Adequate for Discharge 09/28/2020 0729 by Durward Fortes, RN Outcome: Progressing   Problem: Elimination: Goal: Will not experience complications related to bowel motility 09/28/2020 1023 by Durward Fortes, RN Outcome: Adequate for Discharge 09/28/2020 0729 by Durward Fortes, RN Outcome: Progressing Goal: Will not experience complications related to urinary retention 09/28/2020 1023 by Durward Fortes, RN Outcome: Adequate for Discharge 09/28/2020 0729 by Durward Fortes, RN Outcome: Progressing   Problem: Pain Managment: Goal: General experience of comfort will improve 09/28/2020 1023 by Durward Fortes, RN Outcome: Adequate for Discharge 09/28/2020 0729 by Durward Fortes, RN Outcome: Progressing   Problem: Safety: Goal: Ability to remain free from injury will improve 09/28/2020 1023 by Durward Fortes, RN Outcome: Adequate for Discharge 09/28/2020 0729 by Durward Fortes, RN Outcome: Progressing   Problem: Skin Integrity: Goal: Risk for impaired skin integrity will decrease 09/28/2020 1023 by Durward Fortes, RN Outcome: Adequate for Discharge 09/28/2020 0729 by Durward Fortes, RN Outcome: Progressing

## 2020-10-07 ENCOUNTER — Other Ambulatory Visit: Payer: Self-pay

## 2020-10-07 ENCOUNTER — Ambulatory Visit (INDEPENDENT_AMBULATORY_CARE_PROVIDER_SITE_OTHER): Payer: BC Managed Care – PPO | Admitting: Emergency Medicine

## 2020-10-07 DIAGNOSIS — I255 Ischemic cardiomyopathy: Secondary | ICD-10-CM

## 2020-10-07 LAB — CUP PACEART INCLINIC DEVICE CHECK
Battery Remaining Longevity: 54 mo
Brady Statistic RA Percent Paced: 0.23 %
Brady Statistic RV Percent Paced: 99.49 %
Date Time Interrogation Session: 20211118085930
HighPow Impedance: 68.625
Implantable Lead Implant Date: 20211108
Implantable Lead Implant Date: 20211108
Implantable Lead Implant Date: 20211108
Implantable Lead Location: 753858
Implantable Lead Location: 753859
Implantable Lead Location: 753860
Implantable Pulse Generator Implant Date: 20211108
Lead Channel Impedance Value: 475 Ohm
Lead Channel Impedance Value: 512.5 Ohm
Lead Channel Impedance Value: 625 Ohm
Lead Channel Pacing Threshold Amplitude: 1 V
Lead Channel Pacing Threshold Amplitude: 1 V
Lead Channel Pacing Threshold Amplitude: 1.25 V
Lead Channel Pacing Threshold Pulse Width: 0.5 ms
Lead Channel Pacing Threshold Pulse Width: 0.5 ms
Lead Channel Pacing Threshold Pulse Width: 0.8 ms
Lead Channel Sensing Intrinsic Amplitude: 4.5 mV
Lead Channel Sensing Intrinsic Amplitude: 8.3 mV
Lead Channel Setting Pacing Amplitude: 3.5 V
Lead Channel Setting Pacing Amplitude: 3.5 V
Lead Channel Setting Pacing Amplitude: 3.5 V
Lead Channel Setting Pacing Pulse Width: 0.5 ms
Lead Channel Setting Pacing Pulse Width: 0.5 ms
Lead Channel Setting Sensing Sensitivity: 0.5 mV
Pulse Gen Serial Number: 810006972

## 2020-10-07 NOTE — Progress Notes (Signed)
Wound check/device check appointment. Steri-strips removed. Wound without redness or edema. Incision edges approximated, wound well healed. Normal device function. Sensing and impedances consistent with implant measurements. RA/RV threshold consistent with implant measurements, LV threshold initially 1.75 mV @ 0.5 ms, extended pulse width to 0.84ms with 1.0 V.  Patient bi-ventricularly pacing 99% of the time. Device programmed at 3.5V for extra safety margin until 3 month visit. Histogram distribution appropriate for patient and level of activity. No mode switches or ventricular arrhythmias noted. Patient educated about wound care, arm mobility, lifting restrictions, shock plan. ROV in 3 months with implanting physician. Next home remote 12/29/2020.

## 2020-12-29 ENCOUNTER — Ambulatory Visit (INDEPENDENT_AMBULATORY_CARE_PROVIDER_SITE_OTHER): Payer: No Typology Code available for payment source

## 2020-12-29 DIAGNOSIS — I255 Ischemic cardiomyopathy: Secondary | ICD-10-CM | POA: Diagnosis not present

## 2020-12-29 DIAGNOSIS — I5042 Chronic combined systolic (congestive) and diastolic (congestive) heart failure: Secondary | ICD-10-CM

## 2020-12-30 LAB — CUP PACEART REMOTE DEVICE CHECK
Battery Remaining Longevity: 47 mo
Battery Remaining Percentage: 92 %
Battery Voltage: 2.99 V
Brady Statistic AP VP Percent: 1 %
Brady Statistic AP VS Percent: 1 %
Brady Statistic AS VP Percent: 99 %
Brady Statistic AS VS Percent: 1 %
Brady Statistic RA Percent Paced: 1 %
Date Time Interrogation Session: 20220209020005
HighPow Impedance: 72 Ohm
Implantable Lead Implant Date: 20211108
Implantable Lead Implant Date: 20211108
Implantable Lead Implant Date: 20211108
Implantable Lead Location: 753858
Implantable Lead Location: 753859
Implantable Lead Location: 753860
Implantable Pulse Generator Implant Date: 20211108
Lead Channel Impedance Value: 410 Ohm
Lead Channel Impedance Value: 480 Ohm
Lead Channel Impedance Value: 750 Ohm
Lead Channel Pacing Threshold Amplitude: 1 V
Lead Channel Pacing Threshold Amplitude: 1 V
Lead Channel Pacing Threshold Amplitude: 1.25 V
Lead Channel Pacing Threshold Pulse Width: 0.5 ms
Lead Channel Pacing Threshold Pulse Width: 0.5 ms
Lead Channel Pacing Threshold Pulse Width: 0.8 ms
Lead Channel Sensing Intrinsic Amplitude: 12 mV
Lead Channel Sensing Intrinsic Amplitude: 2.4 mV
Lead Channel Setting Pacing Amplitude: 3.5 V
Lead Channel Setting Pacing Amplitude: 3.5 V
Lead Channel Setting Pacing Amplitude: 3.5 V
Lead Channel Setting Pacing Pulse Width: 0.5 ms
Lead Channel Setting Pacing Pulse Width: 0.8 ms
Lead Channel Setting Sensing Sensitivity: 0.5 mV
Pulse Gen Serial Number: 810006972

## 2021-01-04 ENCOUNTER — Encounter: Payer: No Typology Code available for payment source | Admitting: Cardiology

## 2021-01-04 NOTE — Progress Notes (Signed)
Remote ICD transmission.   

## 2021-02-01 ENCOUNTER — Encounter: Payer: Self-pay | Admitting: Cardiology

## 2021-02-01 ENCOUNTER — Other Ambulatory Visit: Payer: Self-pay

## 2021-02-01 ENCOUNTER — Ambulatory Visit (INDEPENDENT_AMBULATORY_CARE_PROVIDER_SITE_OTHER): Payer: Commercial Managed Care - PPO | Admitting: Cardiology

## 2021-02-01 VITALS — BP 110/68 | HR 109 | Ht 70.0 in | Wt 190.0 lb

## 2021-02-01 DIAGNOSIS — I255 Ischemic cardiomyopathy: Secondary | ICD-10-CM

## 2021-02-01 DIAGNOSIS — Z95810 Presence of automatic (implantable) cardiac defibrillator: Secondary | ICD-10-CM | POA: Diagnosis not present

## 2021-02-01 DIAGNOSIS — I5042 Chronic combined systolic (congestive) and diastolic (congestive) heart failure: Secondary | ICD-10-CM | POA: Diagnosis not present

## 2021-02-01 NOTE — Progress Notes (Signed)
Electrophysiology Office Follow up Visit Note:    Date:  02/01/2021   ID:  Christopher Garcia, DOB 1966-08-07, MRN [id number]  PCP:  Perley Jain  [facility] Cardiologist:  No primary care provider on file.  [facility] Electrophysiologist:  Lanier Prude, MD    Interval History:    Christopher Garcia is a 55 y.o. male who presents for a follow up visit after a biventricular ICD implant on September 27, 2020.  Since the implant he has been doing okay but has not noted a significant improvement in his symptoms.  He still fairly active but notes a poor exercise tolerance with easy fatigability.  He is with his wife in clinic today.  No syncope or presyncope.  No ICD shocks.    Past Medical History:  Diagnosis Date   Abdominal pain    Abnormal EKG    Abnormal liver enzymes    Abscess of great toe    Bloody stools    BMI 23.0-23.9, adult    Body aches    Bronchitis    CAD (coronary artery disease)    Cardiomyopathy (HCC)    Chest pain    CHF (congestive heart failure) (HCC)    Chills with fever    COPD (chronic obstructive pulmonary disease) (HCC)    Cough    Dehydration    Dermatitis    Diverticulitis    DM (diabetes mellitus) (HCC)    Dyslipidemia    Dyspnea    Dysthymia    Dysuria    ED (erectile dysfunction)    Fatigue    Flu-like symptoms    Gastroenteritis    Gout    Hernia, inguinal    Hip pain    Hypercholesterolemia    Hypertension    Ischemic heart disease    Leukocytosis    Loose stools    MI (myocardial infarction) (HCC)    Mixed hyperlipidemia    Nausea    Nausea and vomiting    NSTEMI (non-ST elevated myocardial infarction) (HCC)    Paresthesia    Poor dentition    Sinusitis    SOB (shortness of breath)    Sore throat    Tobacco abuse    URI (upper respiratory infection)    Vitamin B12 deficiency    Vitamin D deficiency    Wheezing     Past Surgical History:  Procedure  Laterality Date   BIV ICD INSERTION CRT-D N/A 09/27/2020   Procedure: BIV ICD INSERTION CRT-D;  Surgeon: Lanier Prude, MD;  Location: [facility] INVASIVE CV LAB;  Service: Cardiovascular;  Laterality: N/A;   CORONARY ARTERY BYPASS GRAFT      Current Medications: Current Meds  Medication Sig   acetaminophen (TYLENOL) 500 MG tablet Take 1,000 mg by mouth every 6 (six) hours as needed for headache.    albuterol (VENTOLIN HFA) 108 (90 Base) MCG/ACT inhaler Inhale 1 puff into the lungs every 6 (six) hours as needed for wheezing or shortness of breath.   Albuterol Sulfate 2.5 MG/0.5ML NEBU Inhale 2.5 mg into the lungs daily as needed (COPD).    ascorbic acid (VITAMIN C) 1000 MG tablet Take 1,000 mg by mouth daily.    aspirin EC 81 MG tablet Take 81 mg by mouth daily. Swallow whole.   Budeson-Glycopyrrol-Formoterol (BREZTRI AEROSPHERE) 160-9-4.8 MCG/ACT AERO Inhale 2 puffs into the lungs 2 (two) times daily.   Cholecalciferol 50 MCG (2000 UT) CAPS Take 4,000 Units by mouth daily.    dapagliflozin propanediol (FARXIGA) 10  MG TABS tablet Take 10 mg by mouth daily.   digoxin (LANOXIN) 0.125 MG tablet Take 0.125 mg by mouth See admin instructions. Take tues., thurs, Sat and Sun.   JANUMET XR 50-500 MG TB24 Take 1 tablet by mouth daily.   nitroGLYCERIN (NITROSTAT) 0.4 MG SL tablet Place 0.4 mg under the tongue every 5 (five) minutes as needed for chest pain.   ondansetron (ZOFRAN) 8 MG tablet Take 8 mg by mouth every 8 (eight) hours as needed for nausea or vomiting.    sacubitril-valsartan (ENTRESTO) 24-26 MG Take 1 tablet by mouth daily.   simvastatin (ZOCOR) 20 MG tablet Take 20 mg by mouth at bedtime.    spironolactone (ALDACTONE) 25 MG tablet Take 25 mg by mouth daily.   ticagrelor (BRILINTA) 90 MG TABS tablet Take 90 mg by mouth 2 (two) times daily.    torsemide (DEMADEX) 20 MG tablet Take 20 mg by mouth daily as needed (Take if weight is over 187 lbs).    Zinc 100 MG TABS Take  100 mg by mouth daily.      Allergies:   Patient has no known allergies.   Social History   Socioeconomic History   Marital status: Married    Spouse name: Not on file   Number of children: Not on file   Years of education: Not on file   Highest education level: Not on file  Occupational History   Not on file  Tobacco Use   Smoking status: Former Smoker   Smokeless tobacco: Never Used  Substance and Sexual Activity   Alcohol use: Never   Drug use: Never   Sexual activity: Not on file  Other Topics Concern   Not on file  Social History Narrative   Not on file   Social Determinants of Health   Financial Resource Strain: Not on file  Food Insecurity: Not on file  Transportation Needs: Not on file  Physical Activity: Not on file  Stress: Not on file  Social Connections: Not on file     Family History: The patient's family history includes Diabetes in his father, mother, and sister; Heart disease in his father; Hypertension in his father and mother.  ROS:   Please see the history of present illness.    All other systems reviewed and are negative.  EKGs/Labs/Other Studies Reviewed:    The following studies were reviewed today:  February 01, 2021 BiV ICD interrogation and reprogramming in clinic Presenting rhythm a sensed, BiV paced with a heart rate of 110 bpm BiV pacing 99% of the time Battery longevity estimated at 6.5 to 6.9 years No VT therapies delivered Heart rate histogram shows the majority of heart rates between 80 and 100 bpm His underlying rhythm shows a wide right bundle branch block.  QRS duration is 150 ms.  PR interval is 140 ms. RV only demonstrated a left bundle pattern in lead V1 with a biphasic QRS in lead I Given the right bundle branch block at baseline, needed to [place] RV to achieve treated biventricular pacing.  His robust AV conduction complicates his a timing RV preset about 20 ms shows a QRS duration of 140 ms without a  significant component coming from the right ventricle Right ventricular pacing first by 80 ms shows slightly more RV contribution although still dominated by his intrinsic conduction with right bundle branch block Shortening the AV delay 200 ms increasing RV showed only a slight contribution ventricular pacing Changing to left ventricular pacing vector ~1 to  RV coil and preexcitation the RV by 80 ms and shortening the AV delay narrow the QRS 240 ms and created more of an RSR prime in lead V1 with more prominent contribution of the right ventricular lead Final programming was already preexcited by 80 ms with a sensed AV delay 130 ms and a left ventricular pacing vector of 1 to RV coil.  Capture threshold was 0.7 at 0.8.  Lead impedance in that configuration was 880 ohms.   Recent Labs: 09/27/2020: BUN 14; Creatinine, Ser 1.21; Hemoglobin 15.3; Platelets 199; Potassium 4.0; Sodium 139  Recent Lipid Panel No results found for: CHOL, TRIG, HDL, CHOLHDL, VLDL, LDLCALC, LDLDIRECT  Physical Exam:    VS:  BP 110/68    Pulse (!) 109    Ht 5\' 10"  (1.778 m)    Wt 190 lb (86.2 kg)    BMI 27.26 kg/m     Wt Readings from Last 3 Encounters:  02/01/21 190 lb (86.2 kg)  09/27/20 186 lb (84.4 kg)  09/06/20 197 lb (89.4 kg)     GEN:  Well nourished, well developed in no acute distress HEENT: Normal NECK: No JVD; No carotid bruits LYMPHATICS: No lymphadenopathy CARDIAC: RRR, no murmurs, rubs, gallops.  Pacemaker pocket well-healed.  Warm extremities.  Nonedematous. RESPIRATORY:  Clear to auscultation without rales, wheezing or rhonchi  ABDOMEN: Soft, non-tender, non-distended MUSCULOSKELETAL:  No edema; No deformity  SKIN: Warm and dry NEUROLOGIC:  Alert and oriented x 3 PSYCHIATRIC:  Normal affect   ASSESSMENT:    1. Ischemic cardiomyopathy    PLAN:    In order of problems listed above:  1. Chronic combined systolic and diastolic heart failure secondary to severe ischemic  cardiomyopathy Patient with NYHA class III symptoms.  Warm and dry during today's exam.  He is followed closely by Dr. 09/08/20 at [facility] for his heart failure.  She is adjusting medical therapy and is discussing advanced therapies with the patient and his wife.  We discussed device related therapy for advanced heart failure during today's visit at length.  I am concerned given his wide right bundle branch block that our CRT optimization attempts may be limited.  I am hopeful with today's adjustments that we got more contribution from the right ventricle and hopefully were able to achieve more resynchronized ventricular contraction.  We discussed how it will take time to assess response to these changes.  Would plan to see him back in 3 months.  I will defer timing of repeat echo to his heart failure cardiologist at [facility].  2.  CRT-D in place Leads functioning well.  Battery longevity good.  Device reprogramming as above.   Medication Adjustments/Labs and Tests Ordered: Current medicines are reviewed at length with the patient today.  Concerns regarding medicines are outlined above.  No orders of the defined types were placed in this encounter.  No orders of the defined types were placed in this encounter.    Signed, [facility], MD, [facility]  02/01/2021 6:35 PM    Electrophysiology [facility]

## 2021-02-01 NOTE — Patient Instructions (Addendum)
Medication Instructions:  Your physician recommends that you continue on your current medications as directed. Please refer to the Current Medication list given to you today.  *If you need a refill on your cardiac medications before your next appointment, please call your pharmacy*   Lab Work: None ordered  Testing/Procedures: None ordered   Follow-Up: At [facility], you and your health needs are our priority.  As part of our continuing mission to provide you with exceptional heart care, we have created designated Provider Care Teams.  These Care Teams include your primary Cardiologist (physician) and Advanced Practice Providers (APPs -  Physician Assistants and Nurse Practitioners) who all work together to provide you with the care you need, when you need it.  Remote monitoring is used to monitor your Pacemaker or ICD from home. This monitoring reduces the number of office visits required to check your device to one time per year. It allows [place] to keep an eye on the functioning of your device to ensure it is working properly. You are scheduled for a device check from home on 03/30/2021. You may send your transmission at any time that day. If you have a wireless device, the transmission will be sent automatically. After your physician reviews your transmission, you will receive a postcard with your next transmission date.  Your next appointment:   3 month(s)   --- 05/06/2021 @ 4:00 pm  The format for your next appointment:   In Person  Provider:   Steffanie Dunn, MD   Thank you for choosing [facility]!!   [phone]    Other Instructions

## 2021-03-30 ENCOUNTER — Ambulatory Visit (INDEPENDENT_AMBULATORY_CARE_PROVIDER_SITE_OTHER): Payer: 59

## 2021-03-30 DIAGNOSIS — I5042 Chronic combined systolic (congestive) and diastolic (congestive) heart failure: Secondary | ICD-10-CM

## 2021-03-30 DIAGNOSIS — I255 Ischemic cardiomyopathy: Secondary | ICD-10-CM

## 2021-03-31 LAB — CUP PACEART REMOTE DEVICE CHECK
Battery Remaining Longevity: 73 mo
Battery Remaining Percentage: 87 %
Battery Voltage: 3.01 V
Brady Statistic AP VP Percent: 1 %
Brady Statistic AP VS Percent: 1 %
Brady Statistic AS VP Percent: 99 %
Brady Statistic AS VS Percent: 1 %
Brady Statistic RA Percent Paced: 1 %
Date Time Interrogation Session: 20220511030050
HighPow Impedance: 72 Ohm
Implantable Lead Implant Date: 20211108
Implantable Lead Implant Date: 20211108
Implantable Lead Implant Date: 20211108
Implantable Lead Location: 753858
Implantable Lead Location: 753859
Implantable Lead Location: 753860
Implantable Pulse Generator Implant Date: 20211108
Lead Channel Impedance Value: 440 Ohm
Lead Channel Impedance Value: 450 Ohm
Lead Channel Impedance Value: 480 Ohm
Lead Channel Pacing Threshold Amplitude: 0.75 V
Lead Channel Pacing Threshold Amplitude: 0.75 V
Lead Channel Pacing Threshold Amplitude: 1 V
Lead Channel Pacing Threshold Pulse Width: 0.5 ms
Lead Channel Pacing Threshold Pulse Width: 0.5 ms
Lead Channel Pacing Threshold Pulse Width: 0.8 ms
Lead Channel Sensing Intrinsic Amplitude: 12 mV
Lead Channel Sensing Intrinsic Amplitude: 3 mV
Lead Channel Setting Pacing Amplitude: 2 V
Lead Channel Setting Pacing Amplitude: 2 V
Lead Channel Setting Pacing Amplitude: 2.5 V
Lead Channel Setting Pacing Pulse Width: 0.5 ms
Lead Channel Setting Pacing Pulse Width: 0.8 ms
Lead Channel Setting Sensing Sensitivity: 0.5 mV
Pulse Gen Serial Number: 810006972

## 2021-04-21 NOTE — Progress Notes (Signed)
Remote ICD transmission.   

## 2021-05-06 ENCOUNTER — Encounter: Payer: 59 | Admitting: Cardiology

## 2021-05-06 NOTE — Progress Notes (Deleted)
Electrophysiology Office Follow up Visit Note:    Date:  05/06/2021   ID:  Christopher Garcia, DOB 02-11-1966, MRN [id number]  PCP:  Christopher Garcia  [facility] Cardiologist:  None  [facility] Electrophysiologist:  Christopher Prude, MD    Interval History:    Christopher Garcia is a 55 y.o. male who presents for a follow up visit.  I last saw the patient February 01, 2021 after his biventricular ICD was implanted September 27, 2020.  At that appointment he reported continued fatigue and poor exercise tolerance.  No syncope or presyncope was reported at that appointment.  There had been no ICD therapies delivered at the time of that appointment.  He is also followed by Dr. Laverda Page at [facility] for his heart failure.  There have been previous discussions about advanced therapies.  It looks like he was seen by Dr. Laverda Page on Apr 14, 2021.  At that appointment, the patient performed a bike test and Dr. Laverda Page was considering getting a right heart catheterization as a precursor to advanced therapies discussions.     Past Medical History:  Diagnosis Date   Abdominal pain    Abnormal EKG    Abnormal liver enzymes    Abscess of great toe    Bloody stools    BMI 23.0-23.9, adult    Body aches    Bronchitis    CAD (coronary artery disease)    Cardiomyopathy (HCC)    Chest pain    CHF (congestive heart failure) (HCC)    Chills with fever    COPD (chronic obstructive pulmonary disease) (HCC)    Cough    Dehydration    Dermatitis    Diverticulitis    DM (diabetes mellitus) (HCC)    Dyslipidemia    Dyspnea    Dysthymia    Dysuria    ED (erectile dysfunction)    Fatigue    Flu-like symptoms    Gastroenteritis    Gout    Hernia, inguinal    Hip pain    Hypercholesterolemia    Hypertension    Ischemic heart disease    Leukocytosis    Loose stools    MI (myocardial infarction) (HCC)    Mixed hyperlipidemia    Nausea    Nausea and vomiting    NSTEMI (non-ST elevated myocardial  infarction) (HCC)    Paresthesia    Poor dentition    Sinusitis    SOB (shortness of breath)    Sore throat    Tobacco abuse    URI (upper respiratory infection)    Vitamin B12 deficiency    Vitamin D deficiency    Wheezing     Past Surgical History:  Procedure Laterality Date   BIV ICD INSERTION CRT-D N/A 09/27/2020   Procedure: BIV ICD INSERTION CRT-D;  Surgeon: Christopher Prude, MD;  Location: [facility] INVASIVE CV LAB;  Service: Cardiovascular;  Laterality: N/A;   CORONARY ARTERY BYPASS GRAFT      Current Medications: No outpatient medications have been marked as taking for the 05/06/21 encounter (Appointment) with Christopher Prude, MD.     Allergies:   Patient has no known allergies.   Social History   Socioeconomic History   Marital status: Married    Spouse name: Not on file   Number of children: Not on file   Years of education: Not on file   Highest education level: Not on file  Occupational History   Not on file  Tobacco Use  Smoking status: Former    Pack years: 0.00   Smokeless tobacco: Never  Substance and Sexual Activity   Alcohol use: Never   Drug use: Never   Sexual activity: Not on file  Other Topics Concern   Not on file  Social History Narrative   Not on file   Social Determinants of Health   Financial Resource Strain: Not on file  Food Insecurity: Not on file  Transportation Needs: Not on file  Physical Activity: Not on file  Stress: Not on file  Social Connections: Not on file     Family History: The patient's family history includes Diabetes in his father, mother, and sister; Heart disease in his father; Hypertension in his father and mother.  ROS:   Please see the history of present illness.    All other systems reviewed and are negative.  EKGs/Labs/Other Studies Reviewed:    The following studies were reviewed today:  Mar 31, 2021 remote interrogation personally reviewed Greater than 99% BiV pacing  EKG:  The ekg ordered  today demonstrates ***  Recent Labs: 09/27/2020: BUN 14; Creatinine, Ser 1.21; Hemoglobin 15.3; Platelets 199; Potassium 4.0; Sodium 139  Recent Lipid Panel No results found for: CHOL, TRIG, HDL, CHOLHDL, VLDL, LDLCALC, LDLDIRECT  Physical Exam:    VS:  There were no vitals taken for this visit.    Wt Readings from Last 3 Encounters:  02/01/21 190 lb (86.2 kg)  09/27/20 186 lb (84.4 kg)  09/06/20 197 lb (89.4 kg)     GEN: *** Well nourished, well developed in no acute distress HEENT: Normal NECK: No JVD; No carotid bruits LYMPHATICS: No lymphadenopathy CARDIAC: ***RRR, no murmurs, rubs, gallops RESPIRATORY:  Clear to auscultation without rales, wheezing or rhonchi  ABDOMEN: Soft, non-tender, non-distended MUSCULOSKELETAL:  No edema; No deformity  SKIN: Warm and dry NEUROLOGIC:  Alert and oriented x 3 PSYCHIATRIC:  Normal affect   ASSESSMENT:    No diagnosis found. PLAN:    In order of problems listed above:    Total time spent with patient today *** minutes. This includes reviewing records, evaluating the patient and coordinating care.   Medication Adjustments/Labs and Tests Ordered: Current medicines are reviewed at length with the patient today.  Concerns regarding medicines are outlined above.  No orders of the defined types were placed in this encounter.  No orders of the defined types were placed in this encounter.    Signed, Steffanie Dunn, MD, [facility], [facility] 05/06/2021 7:52 AM    Electrophysiology [facility]

## 2021-06-24 ENCOUNTER — Other Ambulatory Visit: Payer: Self-pay

## 2021-06-24 ENCOUNTER — Encounter: Payer: Self-pay | Admitting: Cardiology

## 2021-06-24 ENCOUNTER — Ambulatory Visit (INDEPENDENT_AMBULATORY_CARE_PROVIDER_SITE_OTHER): Payer: Commercial Managed Care - PPO | Admitting: Cardiology

## 2021-06-24 VITALS — BP 128/72 | HR 77 | Ht 70.0 in | Wt 189.0 lb

## 2021-06-24 DIAGNOSIS — I255 Ischemic cardiomyopathy: Secondary | ICD-10-CM | POA: Diagnosis not present

## 2021-06-24 DIAGNOSIS — I5042 Chronic combined systolic (congestive) and diastolic (congestive) heart failure: Secondary | ICD-10-CM | POA: Diagnosis not present

## 2021-06-24 DIAGNOSIS — Z95810 Presence of automatic (implantable) cardiac defibrillator: Secondary | ICD-10-CM | POA: Diagnosis not present

## 2021-06-24 NOTE — Patient Instructions (Addendum)
Medication Instructions:  Your physician recommends that you continue on your current medications as directed. Please refer to the Current Medication list given to you today. *If you need a refill on your cardiac medications before your next appointment, please call your pharmacy*  Lab Work: None ordered. If you have labs (blood work) drawn today and your tests are completely normal, you will receive your results only by: MyChart Message (if you have MyChart) OR A paper copy in the mail If you have any lab test that is abnormal or we need to change your treatment, we will call you to review the results.  Testing/Procedures: None ordered.  Follow-Up: At [facility], you and your health needs are our priority.  As part of our continuing mission to provide you with exceptional heart care, we have created designated Provider Care Teams.  These Care Teams include your primary Cardiologist (physician) and Advanced Practice Providers (APPs -  Physician Assistants and Nurse Practitioners) who all work together to provide you with the care you need, when you need it.  Your next appointment:   Your physician wants you to follow-up in: 6 months with Lanier Prude, MD. Bonita Quin will receive a reminder letter in the mail two months in advance. If you don't receive a letter, please call our office to schedule the follow-up appointment.  Remote monitoring is used to monitor your ICD from home. This monitoring reduces the number of office visits required to check your device to one time per year. It allows [place] to keep an eye on the functioning of your device to ensure it is working properly. You are scheduled for a device check from home on 06/29/2021. You may send your transmission at any time that day. If you have a wireless device, the transmission will be sent automatically. After your physician reviews your transmission, you will receive a postcard with your next transmission date.

## 2021-06-24 NOTE — Progress Notes (Signed)
Electrophysiology Office Follow up Visit Note:    Date:  06/24/2021   ID:  Christopher Garcia, DOB 1966-01-31, MRN [id number]  PCP:  Emelia Loron  [facility] Cardiologist:  None  [facility] Electrophysiologist:  Vickie Epley, MD    Interval History:    Christopher Garcia is a 55 y.o. male who presents for a follow up visit after CRT-D implant on September 27, 2020.  He has done well since implant.  He thinks his energy level has improved.  When I first met him he was unable to walk to the mailbox and now he is walking 30 minutes on a treadmill.  Device interrogation shows stable lead parameters.  He is biventricular pacing greater than 99% of the time.     Past Medical History:  Diagnosis Date   Abdominal pain    Abnormal EKG    Abnormal liver enzymes    Abscess of great toe    Bloody stools    BMI 23.0-23.9, adult    Body aches    Bronchitis    CAD (coronary artery disease)    Cardiomyopathy (HCC)    Chest pain    CHF (congestive heart failure) (HCC)    Chills with fever    COPD (chronic obstructive pulmonary disease) (HCC)    Cough    Dehydration    Dermatitis    Diverticulitis    DM (diabetes mellitus) ([place])    Dyslipidemia    Dyspnea    Dysthymia    Dysuria    ED (erectile dysfunction)    Fatigue    Flu-like symptoms    Gastroenteritis    Gout    Hernia, inguinal    Hip pain    Hypercholesterolemia    Hypertension    Ischemic heart disease    Leukocytosis    Loose stools    MI (myocardial infarction) ([place])    Mixed hyperlipidemia    Nausea    Nausea and vomiting    NSTEMI (non-ST elevated myocardial infarction) (HCC)    Paresthesia    Poor dentition    Sinusitis    SOB (shortness of breath)    Sore throat    Tobacco abuse    URI (upper respiratory infection)    Vitamin B12 deficiency    Vitamin D deficiency    Wheezing     Past Surgical History:  Procedure Laterality Date   BIV ICD INSERTION CRT-D N/A 09/27/2020   Procedure:  BIV ICD INSERTION CRT-D;  Surgeon: Vickie Epley, MD;  Location: [facility] CV [facility];  Service: Cardiovascular;  Laterality: N/A;   CORONARY ARTERY BYPASS GRAFT      Current Medications: Current Meds  Medication Sig   acetaminophen (TYLENOL) 500 MG tablet Take 1,000 mg by mouth every 6 (six) hours as needed for headache.    albuterol (VENTOLIN HFA) 108 (90 Base) MCG/ACT inhaler Inhale 1 puff into the lungs every 6 (six) hours as needed for wheezing or shortness of breath.   Albuterol Sulfate 2.5 MG/0.5ML NEBU Inhale 2.5 mg into the lungs daily as needed (COPD).    ascorbic acid (VITAMIN C) 1000 MG tablet Take 1,000 mg by mouth daily.    aspirin EC 81 MG tablet Take 81 mg by mouth daily. Swallow whole.   Budeson-Glycopyrrol-Formoterol (BREZTRI AEROSPHERE) 160-9-4.8 MCG/ACT AERO Inhale 2 puffs into the lungs 2 (two) times daily.   Cholecalciferol 50 MCG (2000 UT) CAPS Take 4,000 Units by mouth daily.    dapagliflozin propanediol (FARXIGA) 10  MG TABS tablet Take 10 mg by mouth daily.   digoxin (LANOXIN) 0.125 MG tablet Take 0.125 mg by mouth See admin instructions. Take tues., thurs, Sat and Sun.   JANUMET XR 50-500 MG TB24 Take 1 tablet by mouth daily.   metoprolol succinate (TOPROL-XL) 25 MG 24 hr tablet Take 1 tablet by mouth daily.   nitroGLYCERIN (NITROSTAT) 0.4 MG SL tablet Place 0.4 mg under the tongue every 5 (five) minutes as needed for chest pain.   ondansetron (ZOFRAN) 8 MG tablet Take 8 mg by mouth every 8 (eight) hours as needed for nausea or vomiting.    sacubitril-valsartan (ENTRESTO) 24-26 MG Take 1 tablet by mouth daily.   simvastatin (ZOCOR) 20 MG tablet Take 20 mg by mouth at bedtime.    spironolactone (ALDACTONE) 25 MG tablet Take 25 mg by mouth daily.   ticagrelor (BRILINTA) 90 MG TABS tablet Take 90 mg by mouth 2 (two) times daily.    torsemide (DEMADEX) 20 MG tablet Take 20 mg by mouth daily as needed (Take if weight is over 187 lbs).    Zinc 100 MG TABS Take 100 mg  by mouth daily.      Allergies:   Patient has no known allergies.   Social History   Socioeconomic History   Marital status: Married    Spouse name: Not on file   Number of children: Not on file   Years of education: Not on file   Highest education level: Not on file  Occupational History   Not on file  Tobacco Use   Smoking status: Former   Smokeless tobacco: Never  Substance and Sexual Activity   Alcohol use: Never   Drug use: Never   Sexual activity: Not on file  Other Topics Concern   Not on file  Social History Narrative   Not on file   Social Determinants of Health   Financial Resource Strain: Not on file  Food Insecurity: Not on file  Transportation Needs: Not on file  Physical Activity: Not on file  Stress: Not on file  Social Connections: Not on file     Family History: The patient's family history includes Diabetes in his father, mother, and sister; Heart disease in his father; Hypertension in his father and mother.  ROS:   Please see the history of present illness.    All other systems reviewed and are negative.  EKGs/Labs/Other Studies Reviewed:    The following studies were reviewed today:  June 24, 2021 in clinic device interrogation personally reviewed Lead parameter stable Battery longevity 6.1 to 6.5 years Greater than 99% BiV pacing today turned on Confirm and turn down his RV output   EKG:  The ekg ordered today demonstrates a sensed, BiV paced.  QRS duration 142 ms.  Recent Labs: 09/27/2020: BUN 14; Creatinine, Ser 1.21; Hemoglobin 15.3; Platelets 199; Potassium 4.0; Sodium 139  Recent Lipid Panel No results found for: CHOL, TRIG, HDL, CHOLHDL, VLDL, LDLCALC, LDLDIRECT  Physical Exam:    VS:  BP 128/72   Pulse 77   Ht 5' 10"  (1.778 m)   Wt 189 lb (85.7 kg)   BMI 27.12 kg/m     Wt Readings from Last 3 Encounters:  06/24/21 189 lb (85.7 kg)  02/01/21 190 lb (86.2 kg)  09/27/20 186 lb (84.4 kg)     GEN:  Well nourished, well  developed in no acute distress HEENT: Normal NECK: No JVD; No carotid bruits LYMPHATICS: No lymphadenopathy CARDIAC: RRR, no murmurs, rubs,  gallops.  CRT-D pocket well-healed. RESPIRATORY:  Clear to auscultation without rales, wheezing or rhonchi  ABDOMEN: Soft, non-tender, non-distended MUSCULOSKELETAL:  No edema; No deformity  SKIN: Warm and dry NEUROLOGIC:  Alert and oriented x 3 PSYCHIATRIC:  Normal affect   ASSESSMENT:    1. Ischemic cardiomyopathy   2. Chronic combined systolic and diastolic heart failure (HCC)   3. Cardiac resynchronization therapy defibrillator (CRT-D) in place    PLAN:    In order of problems listed above:  1. Ischemic cardiomyopathy No ischemic symptoms.  Continue aspirin.  2. Chronic combined systolic and diastolic heart failure (HCC) NYHA class II.  Warm and dry.  Continue Farxiga, digoxin, Entresto, spironolactone, torsemide.  He is also recently been restarted on metoprolol.  Follow-up with [facility] for possible transplant evaluation.  Overall I am encouraged that he has had a positive response from his CRT-D.  3. Cardiac resynchronization therapy defibrillator (CRT-D) in place Device functioning well as above.     Plan to see back in 6 months which will be 1 year after implant.    Medication Adjustments/Labs and Tests Ordered: Current medicines are reviewed at length with the patient today.  Concerns regarding medicines are outlined above.  Orders Placed This Encounter  Procedures   EKG 12-Lead   No orders of the defined types were placed in this encounter.    Signed, Lars Mage, MD, [facility], [facility] 06/24/2021 2:17 PM    Electrophysiology [facility]

## 2021-06-29 ENCOUNTER — Ambulatory Visit (INDEPENDENT_AMBULATORY_CARE_PROVIDER_SITE_OTHER): Payer: Commercial Managed Care - PPO

## 2021-06-29 DIAGNOSIS — I255 Ischemic cardiomyopathy: Secondary | ICD-10-CM | POA: Diagnosis not present

## 2021-06-30 LAB — CUP PACEART REMOTE DEVICE CHECK
Battery Remaining Longevity: 77 mo
Battery Remaining Percentage: 83 %
Battery Voltage: 2.99 V
Brady Statistic AP VP Percent: 2.2 %
Brady Statistic AP VS Percent: 1 %
Brady Statistic AS VP Percent: 97 %
Brady Statistic AS VS Percent: 1 %
Brady Statistic RA Percent Paced: 2.2 %
Date Time Interrogation Session: 20220810020200
HighPow Impedance: 79 Ohm
Implantable Lead Implant Date: 20211108
Implantable Lead Implant Date: 20211108
Implantable Lead Implant Date: 20211108
Implantable Lead Location: 753858
Implantable Lead Location: 753859
Implantable Lead Location: 753860
Implantable Pulse Generator Implant Date: 20211108
Lead Channel Impedance Value: 440 Ohm
Lead Channel Impedance Value: 440 Ohm
Lead Channel Impedance Value: 460 Ohm
Lead Channel Pacing Threshold Amplitude: 0.625 V
Lead Channel Pacing Threshold Amplitude: 1 V
Lead Channel Pacing Threshold Amplitude: 1.125 V
Lead Channel Pacing Threshold Pulse Width: 0.5 ms
Lead Channel Pacing Threshold Pulse Width: 0.5 ms
Lead Channel Pacing Threshold Pulse Width: 0.8 ms
Lead Channel Sensing Intrinsic Amplitude: 10.6 mV
Lead Channel Sensing Intrinsic Amplitude: 2.3 mV
Lead Channel Setting Pacing Amplitude: 1.125
Lead Channel Setting Pacing Amplitude: 2 V
Lead Channel Setting Pacing Amplitude: 2.125
Lead Channel Setting Pacing Pulse Width: 0.5 ms
Lead Channel Setting Pacing Pulse Width: 0.8 ms
Lead Channel Setting Sensing Sensitivity: 0.5 mV
Pulse Gen Serial Number: 810006972

## 2021-07-22 NOTE — Progress Notes (Signed)
Remote ICD transmission.   

## 2021-10-04 ENCOUNTER — Ambulatory Visit (INDEPENDENT_AMBULATORY_CARE_PROVIDER_SITE_OTHER): Payer: Commercial Managed Care - PPO

## 2021-10-04 DIAGNOSIS — I255 Ischemic cardiomyopathy: Secondary | ICD-10-CM | POA: Diagnosis not present

## 2021-10-04 LAB — CUP PACEART REMOTE DEVICE CHECK
Battery Remaining Longevity: 69 mo
Battery Remaining Percentage: 80 %
Battery Voltage: 2.99 V
Brady Statistic AP VP Percent: 1 %
Brady Statistic AP VS Percent: 1 %
Brady Statistic AS VP Percent: 99 %
Brady Statistic AS VS Percent: 1 %
Brady Statistic RA Percent Paced: 1 %
Date Time Interrogation Session: 20221115095024
HighPow Impedance: 61 Ohm
Implantable Lead Implant Date: 20211108
Implantable Lead Implant Date: 20211108
Implantable Lead Implant Date: 20211108
Implantable Lead Location: 753858
Implantable Lead Location: 753859
Implantable Lead Location: 753860
Implantable Pulse Generator Implant Date: 20211108
Lead Channel Impedance Value: 340 Ohm
Lead Channel Impedance Value: 350 Ohm
Lead Channel Impedance Value: 410 Ohm
Lead Channel Pacing Threshold Amplitude: 0.875 V
Lead Channel Pacing Threshold Amplitude: 1 V
Lead Channel Pacing Threshold Amplitude: 1.125 V
Lead Channel Pacing Threshold Pulse Width: 0.5 ms
Lead Channel Pacing Threshold Pulse Width: 0.5 ms
Lead Channel Pacing Threshold Pulse Width: 0.8 ms
Lead Channel Sensing Intrinsic Amplitude: 2.1 mV
Lead Channel Sensing Intrinsic Amplitude: 9.7 mV
Lead Channel Setting Pacing Amplitude: 1.375
Lead Channel Setting Pacing Amplitude: 2 V
Lead Channel Setting Pacing Amplitude: 2.125
Lead Channel Setting Pacing Pulse Width: 0.5 ms
Lead Channel Setting Pacing Pulse Width: 0.8 ms
Lead Channel Setting Sensing Sensitivity: 0.5 mV
Pulse Gen Serial Number: 810006972

## 2021-10-11 ENCOUNTER — Encounter: Payer: Commercial Managed Care - PPO | Admitting: Cardiology

## 2021-10-11 NOTE — Progress Notes (Deleted)
Electrophysiology Office Follow up Visit Note:    Date:  10/11/2021   ID:  Christopher Garcia, DOB 30-Apr-1966, MRN [id number]  PCP:  Doreen Salvage, PA-C  [facility] Cardiologist:  None  [facility] Electrophysiologist:  Lanier Prude, MD    Interval History:    Christopher Garcia is a 55 y.o. male who presents for a follow up visit.   The patient had a BiV ICD implanted September 27, 2020.  He has done well after implant.  Remote monitoring since then has shown stable device function with an excellent biventricular pacing percentage.       Past Medical History:  Diagnosis Date   Abdominal pain    Abnormal EKG    Abnormal liver enzymes    Abscess of great toe    Bloody stools    BMI 23.0-23.9, adult    Body aches    Bronchitis    CAD (coronary artery disease)    Cardiomyopathy (HCC)    Chest pain    CHF (congestive heart failure) (HCC)    Chills with fever    COPD (chronic obstructive pulmonary disease) (HCC)    Cough    Dehydration    Dermatitis    Diverticulitis    DM (diabetes mellitus) (HCC)    Dyslipidemia    Dyspnea    Dysthymia    Dysuria    ED (erectile dysfunction)    Fatigue    Flu-like symptoms    Gastroenteritis    Gout    Hernia, inguinal    Hip pain    Hypercholesterolemia    Hypertension    Ischemic heart disease    Leukocytosis    Loose stools    MI (myocardial infarction) (HCC)    Mixed hyperlipidemia    Nausea    Nausea and vomiting    NSTEMI (non-ST elevated myocardial infarction) (HCC)    Paresthesia    Poor dentition    Sinusitis    SOB (shortness of breath)    Sore throat    Tobacco abuse    URI (upper respiratory infection)    Vitamin B12 deficiency    Vitamin D deficiency    Wheezing     Past Surgical History:  Procedure Laterality Date   BIV ICD INSERTION CRT-D N/A 09/27/2020   Procedure: BIV ICD INSERTION CRT-D;  Surgeon: Lanier Prude, MD;  Location: [facility] INVASIVE CV LAB;  Service: Cardiovascular;   Laterality: N/A;   CORONARY ARTERY BYPASS GRAFT      Current Medications: No outpatient medications have been marked as taking for the 10/11/21 encounter (Appointment) with Lanier Prude, MD.     Allergies:   Patient has no known allergies.   Social History   Socioeconomic History   Marital status: Married    Spouse name: Not on file   Number of children: Not on file   Years of education: Not on file   Highest education level: Not on file  Occupational History   Not on file  Tobacco Use   Smoking status: Former   Smokeless tobacco: Never  Substance and Sexual Activity   Alcohol use: Never   Drug use: Never   Sexual activity: Not on file  Other Topics Concern   Not on file  Social History Narrative   Not on file   Social Determinants of Health   Financial Resource Strain: Not on file  Food Insecurity: Not on file  Transportation Needs: Not on file  Physical Activity: Not on file  Stress: Not on file  Social Connections: Not on file     Family History: The patient's family history includes Diabetes in his father, mother, and sister; Heart disease in his father; Hypertension in his father and mother.  ROS:   Please see the history of present illness.    All other systems reviewed and are negative.  EKGs/Labs/Other Studies Reviewed:    The following studies were reviewed today:    EKG:  The ekg ordered today demonstrates ***  Recent Labs: No results found for requested labs within last 8760 hours.  Recent Lipid Panel No results found for: CHOL, TRIG, HDL, CHOLHDL, VLDL, LDLCALC, LDLDIRECT  Physical Exam:    VS:  There were no vitals taken for this visit.    Wt Readings from Last 3 Encounters:  06/24/21 189 lb (85.7 kg)  02/01/21 190 lb (86.2 kg)  09/27/20 186 lb (84.4 kg)     GEN: *** Well nourished, well developed in no acute distress HEENT: Normal NECK: No JVD; No carotid bruits LYMPHATICS: No lymphadenopathy CARDIAC: ***RRR, no murmurs,  rubs, gallops RESPIRATORY:  Clear to auscultation without rales, wheezing or rhonchi  ABDOMEN: Soft, non-tender, non-distended MUSCULOSKELETAL:  No edema; No deformity  SKIN: Warm and dry NEUROLOGIC:  Alert and oriented x 3 PSYCHIATRIC:  Normal affect        ASSESSMENT:    No diagnosis found. PLAN:    In order of problems listed above:           Total time spent with patient today *** minutes. This includes reviewing records, evaluating the patient and coordinating care.   Medication Adjustments/Labs and Tests Ordered: Current medicines are reviewed at length with the patient today.  Concerns regarding medicines are outlined above.  No orders of the defined types were placed in this encounter.  No orders of the defined types were placed in this encounter.    Signed, Steffanie Dunn, MD, [facility], [facility] 10/11/2021 7:58 AM    Electrophysiology [facility]

## 2021-10-12 NOTE — Progress Notes (Signed)
Remote ICD transmission.   

## 2021-12-25 IMAGING — DX DG CHEST 2V
2 series · 2 of 2 positions shown · non-contrast
Comparison: 03/31/2020

CLINICAL DATA: Defibrillator

EXAM:
CHEST - 2 VIEW

[chest pa]
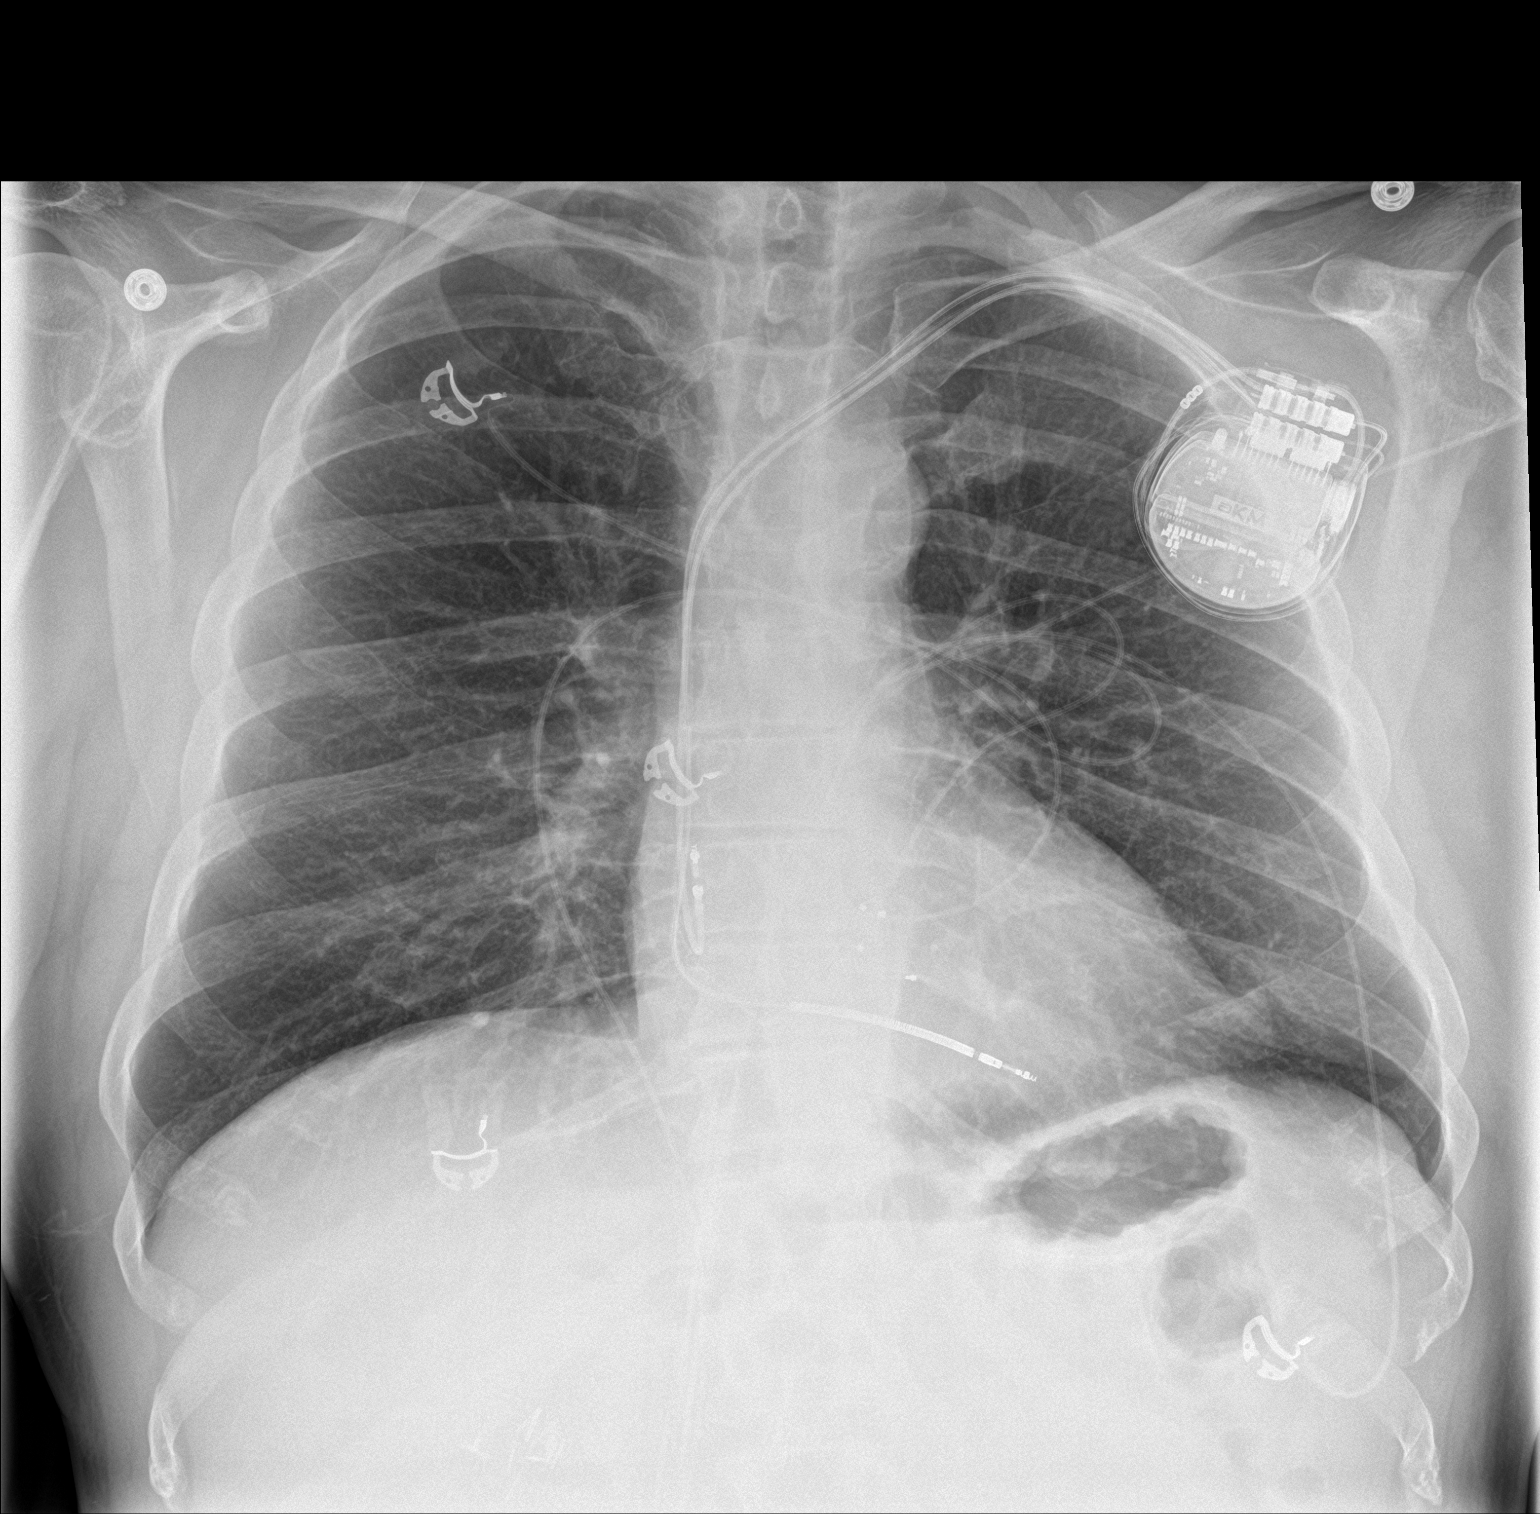

[chest lat]
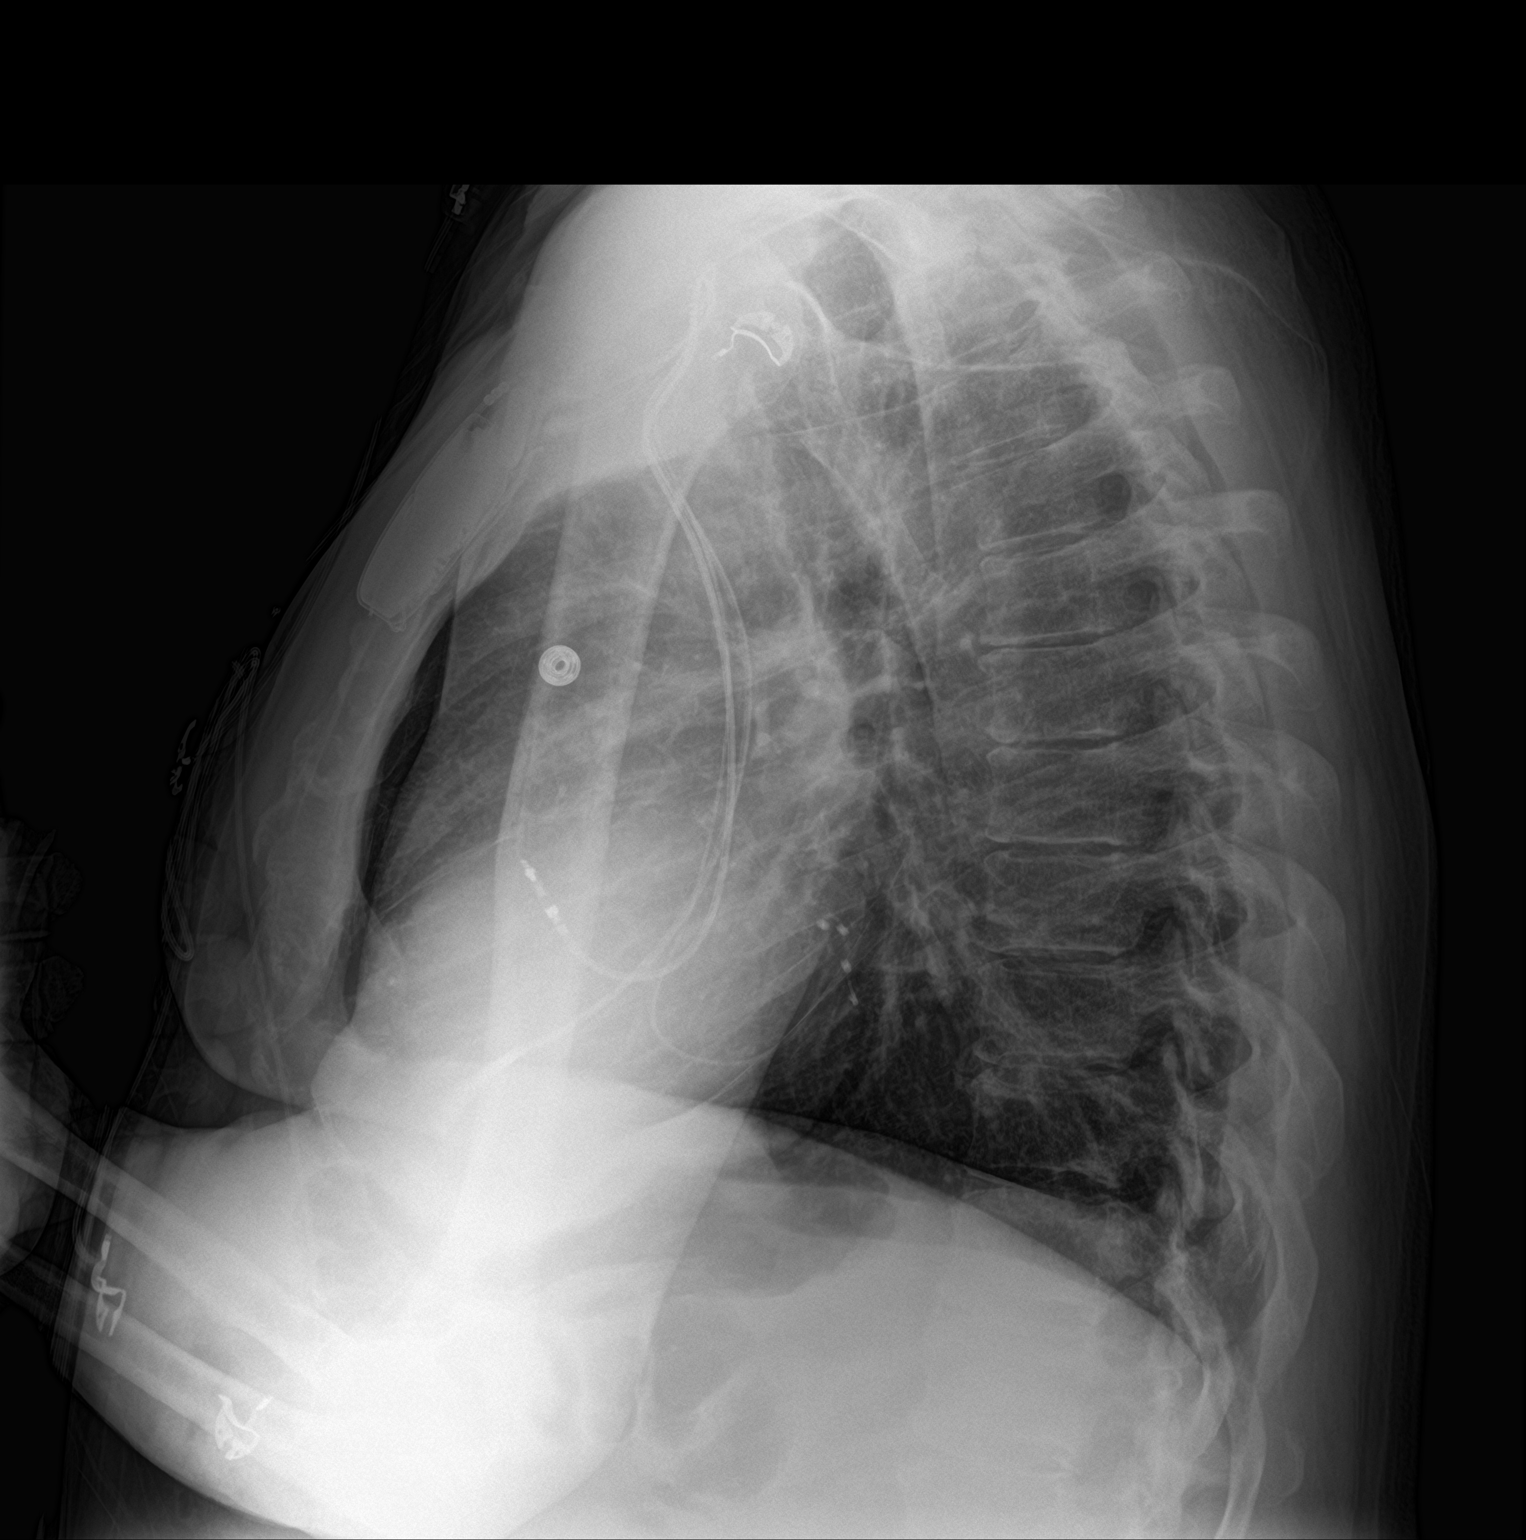

[2 of 2 positions shown; findings below may reference images not displayed]

FINDINGS: Biventricular pacer with right-sided leads over the ventricular apex
and right atrial appendage. The left ventricular lead appears looped
on the frontal view. No edema, effusion, or pneumothorax. Normal
heart size and mediastinal contours.
IMPRESSION: No postprocedural pneumothorax.

## 2022-01-03 ENCOUNTER — Ambulatory Visit (INDEPENDENT_AMBULATORY_CARE_PROVIDER_SITE_OTHER): Payer: Commercial Managed Care - PPO

## 2022-01-03 DIAGNOSIS — I255 Ischemic cardiomyopathy: Secondary | ICD-10-CM

## 2022-01-04 LAB — CUP PACEART REMOTE DEVICE CHECK
Battery Remaining Longevity: 64 mo
Battery Remaining Percentage: 77 %
Battery Voltage: 2.99 V
Brady Statistic AP VP Percent: 1 %
Brady Statistic AP VS Percent: 1 %
Brady Statistic AS VP Percent: 99 %
Brady Statistic AS VS Percent: 1 %
Brady Statistic RA Percent Paced: 1 %
Brady Statistic RV Percent Paced: 99 %
Date Time Interrogation Session: 20230215100754
HighPow Impedance: 59 Ohm
Implantable Lead Implant Date: 20211108
Implantable Lead Implant Date: 20211108
Implantable Lead Implant Date: 20211108
Implantable Lead Location: 753858
Implantable Lead Location: 753859
Implantable Lead Location: 753860
Implantable Pulse Generator Implant Date: 20211108
Lead Channel Impedance Value: 350 Ohm
Lead Channel Impedance Value: 350 Ohm
Lead Channel Impedance Value: 380 Ohm
Lead Channel Pacing Threshold Amplitude: 0.75 V
Lead Channel Pacing Threshold Amplitude: 0.875 V
Lead Channel Pacing Threshold Amplitude: 1 V
Lead Channel Pacing Threshold Pulse Width: 0.5 ms
Lead Channel Pacing Threshold Pulse Width: 0.5 ms
Lead Channel Pacing Threshold Pulse Width: 0.8 ms
Lead Channel Sensing Intrinsic Amplitude: 1.4 mV
Lead Channel Sensing Intrinsic Amplitude: 10.9 mV
Lead Channel Setting Pacing Amplitude: 1.25 V
Lead Channel Setting Pacing Amplitude: 1.875
Lead Channel Setting Pacing Amplitude: 2 V
Lead Channel Setting Pacing Pulse Width: 0.5 ms
Lead Channel Setting Pacing Pulse Width: 0.8 ms
Lead Channel Setting Sensing Sensitivity: 0.5 mV
Pulse Gen Serial Number: 810006972

## 2022-01-09 NOTE — Progress Notes (Signed)
Remote ICD transmission.   

## 2022-01-13 ENCOUNTER — Encounter: Payer: Self-pay | Admitting: Cardiology

## 2022-01-16 ENCOUNTER — Encounter: Payer: Commercial Managed Care - PPO | Admitting: Cardiology

## 2022-03-23 ENCOUNTER — Telehealth: Payer: Self-pay

## 2022-03-23 NOTE — Telephone Encounter (Signed)
Received request from [facility] requesting clearance for MRI ? ? ?SJ system reviewed and MRI compatible. ?Fax completed and returned. ?Advised to program device at DOO at a rate of 10-15 BPM above baseline. ? ?

## 2022-03-27 ENCOUNTER — Encounter: Payer: Self-pay | Admitting: Cardiology

## 2022-03-28 NOTE — Telephone Encounter (Signed)
Looks like appt has been canceled and rescheduled to June.  ?

## 2022-03-30 ENCOUNTER — Encounter: Payer: Self-pay | Admitting: Cardiology

## 2022-04-04 ENCOUNTER — Ambulatory Visit (INDEPENDENT_AMBULATORY_CARE_PROVIDER_SITE_OTHER): Payer: Commercial Managed Care - PPO

## 2022-04-04 DIAGNOSIS — I255 Ischemic cardiomyopathy: Secondary | ICD-10-CM

## 2022-04-06 LAB — CUP PACEART REMOTE DEVICE CHECK
Battery Remaining Longevity: 62 mo
Battery Remaining Percentage: 72 %
Battery Voltage: 2.98 V
Brady Statistic AP VP Percent: 1 %
Brady Statistic AP VS Percent: 1 %
Brady Statistic AS VP Percent: 99 %
Brady Statistic AS VS Percent: 1 %
Brady Statistic RA Percent Paced: 1 %
Date Time Interrogation Session: 20230517105748
HighPow Impedance: 60 Ohm
Implantable Lead Implant Date: 20211108
Implantable Lead Implant Date: 20211108
Implantable Lead Implant Date: 20211108
Implantable Lead Location: 753858
Implantable Lead Location: 753859
Implantable Lead Location: 753860
Implantable Pulse Generator Implant Date: 20211108
Lead Channel Impedance Value: 380 Ohm
Lead Channel Impedance Value: 410 Ohm
Lead Channel Impedance Value: 430 Ohm
Lead Channel Pacing Threshold Amplitude: 0.625 V
Lead Channel Pacing Threshold Amplitude: 1 V
Lead Channel Pacing Threshold Amplitude: 1.125 V
Lead Channel Pacing Threshold Pulse Width: 0.5 ms
Lead Channel Pacing Threshold Pulse Width: 0.5 ms
Lead Channel Pacing Threshold Pulse Width: 0.8 ms
Lead Channel Sensing Intrinsic Amplitude: 11.8 mV
Lead Channel Sensing Intrinsic Amplitude: 2 mV
Lead Channel Setting Pacing Amplitude: 1.125
Lead Channel Setting Pacing Amplitude: 2 V
Lead Channel Setting Pacing Amplitude: 2.125
Lead Channel Setting Pacing Pulse Width: 0.5 ms
Lead Channel Setting Pacing Pulse Width: 0.8 ms
Lead Channel Setting Sensing Sensitivity: 0.5 mV
Pulse Gen Serial Number: 810006972

## 2022-04-20 NOTE — Progress Notes (Signed)
Remote ICD transmission.   

## 2022-05-08 ENCOUNTER — Encounter: Payer: Self-pay | Admitting: Cardiology

## 2022-05-09 ENCOUNTER — Encounter: Payer: Self-pay | Admitting: Cardiology

## 2022-06-20 DEATH — deceased
